# Patient Record
Sex: Male | Born: 1990
Health system: Southern US, Community
[De-identification: ages and names within clinical notes are randomized; demographics above are authoritative.]

---
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
## 2014-01-17 ENCOUNTER — Ambulatory Visit: Payer: Self-pay

## 2014-01-17 ENCOUNTER — Other Ambulatory Visit: Payer: Self-pay | Admitting: Occupational Medicine

## 2014-01-17 DIAGNOSIS — Z021 Encounter for pre-employment examination: Secondary | ICD-10-CM

## 2014-11-26 ENCOUNTER — Encounter (HOSPITAL_COMMUNITY): Payer: Self-pay | Admitting: Emergency Medicine

## 2014-11-26 ENCOUNTER — Emergency Department (INDEPENDENT_AMBULATORY_CARE_PROVIDER_SITE_OTHER)
Admission: EM | Admit: 2014-11-26 | Discharge: 2014-11-26 | Disposition: A | Discharge status: Home / Self Care | Payer: BLUE CROSS/BLUE SHIELD | Source: Home / Self Care | Attending: Family Medicine | Admitting: Family Medicine

## 2014-11-26 DIAGNOSIS — S39011A Strain of muscle, fascia and tendon of abdomen, initial encounter: Secondary | ICD-10-CM

## 2014-11-26 LAB — POCT URINALYSIS DIP (DEVICE)
Bilirubin Urine: NEGATIVE
Glucose, UA: NEGATIVE mg/dL
Hgb urine dipstick: NEGATIVE
KETONES UR: NEGATIVE mg/dL
NITRITE: NEGATIVE
PROTEIN: NEGATIVE mg/dL
Specific Gravity, Urine: 1.02 (ref 1.005–1.030)
Urobilinogen, UA: 0.2 mg/dL (ref 0.0–1.0)
pH: 7.5 (ref 5.0–8.0)

## 2014-11-26 MED ORDER — DICLOFENAC SODIUM 75 MG PO TBEC: 75.0000 mg | DELAYED_RELEASE_TABLET | Freq: Two times a day (BID) | ORAL | Status: DC | PRN

## 2014-11-26 MED ORDER — HYDROCODONE-ACETAMINOPHEN 5-325 MG PO TABS: 1.0000 | ORAL_TABLET | Freq: Four times a day (QID) | ORAL | Status: DC | PRN

## 2014-11-26 NOTE — ED Provider Notes (Signed)
Shawn DoughtyMichael Parma is a 24 y.o. male who presents to Urgent Care today for left abdominal pain and groin pain and testicle pain. Symptoms present for the last 3 days. Patient was moving a very heavy object at work on February 10th when he felt a pulling sensation in his left lower abdominal quadrant. Since then he's had intense pain in that area radiating to the left hip adductor insertional area and left scrotum. He denies any weakness or numbness bowel bladder dysfunction or difficulty walking. The pain is severe and worse with sitting and lying. The pain is relieved by standing. Patient has tried ibuprofen which has not helped. Patient has no symptoms with urination. The injury occurred at work.   History reviewed. No pertinent past medical history. History reviewed. No pertinent past surgical history. History  Substance Use Topics  . Smoking status: Never Smoker   . Smokeless tobacco: Not on file  . Alcohol Use: Yes   ROS as above Medications: No current facility-administered medications for this encounter.   Current Outpatient Prescriptions  Medication Sig Dispense Refill  . diclofenac (VOLTAREN) 75 MG EC tablet Take 1 tablet (75 mg total) by mouth 2 (two) times daily as needed. 60 tablet 0  . HYDROcodone-acetaminophen (NORCO/VICODIN) 5-325 MG per tablet Take 1 tablet by mouth every 6 (six) hours as needed. 30 tablet 0   No Known Allergies   Exam:  BP 136/56 mmHg  Pulse 71  Temp(Src) 97.9 F (36.6 C) (Oral)  Resp 16  SpO2 96% Gen: Well NAD HEENT: EOMI,  MMM Lungs: Normal work of breathing. CTABL Heart: RRR no MRG Abd: NABS, Soft. Nondistended, minimally tender with abdominal muscles relax. Severely tender in the left lower quadrant with abdominal muscles contracted. No guarding or rebound. Exts: Brisk capillary refill, warm and well perfused.  Genitals: No lymphadenopathy. Testicles are descended bilaterally and nontender in the right and mildly tender on the left. No masses or  swelling palpated. No herniation on either the right or left external inguinal ring. Hip exam:  Normal range of motion of the hips bilaterally. Patient has normal hip adductor and abductor strength on the right and normal abductor strength the left with limited hip adductor strength on the left due to pain. Negative Faber test and pretzel stretch bilaterally Negative straight leg raise test bilaterally. Lumbar exam nontender normal low back range of motion Reflexes are equal and normal bilaterally Sensation is intact throughout   Limited musculoskeletal ultrasound of the left abdominal muscle insertional areas:  The pubic symphysis was identified on ultrasound. The insertion of the oblique was visualized. There appears to be some disruption of the muscle fibers in that area without significant increase of Doppler flow. The terminal triangle region was identified. No bulging or herniation with Valsalva.  Results for orders placed or performed during the hospital encounter of 11/26/14 (from the past 24 hour(s))  POCT urinalysis dip (device)     Status: Abnormal   Collection Time: 11/26/14  9:44 AM  Result Value Ref Range   Glucose, UA NEGATIVE NEGATIVE mg/dL   Bilirubin Urine NEGATIVE NEGATIVE   Ketones, ur NEGATIVE NEGATIVE mg/dL   Specific Gravity, Urine 1.020 1.005 - 1.030   Hgb urine dipstick NEGATIVE NEGATIVE   pH 7.5 5.0 - 8.0   Protein, ur NEGATIVE NEGATIVE mg/dL   Urobilinogen, UA 0.2 0.0 - 1.0 mg/dL   Nitrite NEGATIVE NEGATIVE   Leukocytes, UA TRACE (A) NEGATIVE   No results found.  Assessment and Plan: 24 y.o. male with  significant lower abdominal muscular pain. I suspect the patient has torn his pulled his left oblique. He may additionally have a sports hernia however this is difficult to ascertain in the acute setting.  I am doubtful for serous etiology such as inguinal hernia, testicular torsion or epididymitis. Urine culture pending. Treat with rest diclofenac and  Norco. Follow up with sports medicine for further evaluation and management. Return to work Tuesday with limited duties for one week.  Discussed warning signs or symptoms. Please see discharge instructions. Patient expresses understanding.     Rodolph Bong, MD 11/26/14 1057

## 2014-11-26 NOTE — Discharge Instructions (Signed)
Thank you for coming in today. Follow up with sports medicine or orthopedics Take diclofenac twice daily. Use Norco for severe pain. Do not drive after taking this medication. If your belly pain worsens, or you have high fever, bad vomiting, blood in your stool or black tarry stool go to the Emergency Room. Come back or go to the emergency room if you notice new weakness new numbness problems walking or bowel or bladder problems.

## 2014-11-26 NOTE — ED Notes (Signed)
Reports doing a lot of heavy lifting at work.  States six weeks ago felt a tingling sensation in the left testicle which subsided.  States this past Wednesday felt the same sensation but with severe pain in the left testicle, left flank pain that radiates around to the front of the abdomen.  Reports having dysuria and frequency.  Denies hematuria.  No fever, n/v/d.  Pt has tried advil for pain with no relief.

## 2014-11-27 LAB — URINE CULTURE: SPECIAL REQUESTS: NORMAL

## 2016-11-15 DIAGNOSIS — B351 Tinea unguium: Secondary | ICD-10-CM | POA: Diagnosis not present

## 2018-06-18 ENCOUNTER — Other Ambulatory Visit: Payer: Self-pay

## 2018-06-18 ENCOUNTER — Ambulatory Visit (INDEPENDENT_AMBULATORY_CARE_PROVIDER_SITE_OTHER): Payer: BLUE CROSS/BLUE SHIELD

## 2018-06-18 ENCOUNTER — Encounter (HOSPITAL_COMMUNITY): Payer: Self-pay | Admitting: *Deleted

## 2018-06-18 ENCOUNTER — Ambulatory Visit (HOSPITAL_COMMUNITY)
Admission: EM | Admit: 2018-06-18 | Discharge: 2018-06-18 | Disposition: A | Discharge status: Home / Self Care | Payer: BLUE CROSS/BLUE SHIELD | Attending: Family Medicine | Admitting: Family Medicine

## 2018-06-18 DIAGNOSIS — A630 Anogenital (venereal) warts: Secondary | ICD-10-CM | POA: Insufficient documentation

## 2018-06-18 DIAGNOSIS — R0789 Other chest pain: Secondary | ICD-10-CM

## 2018-06-18 DIAGNOSIS — R079 Chest pain, unspecified: Secondary | ICD-10-CM | POA: Diagnosis not present

## 2018-06-18 MED ORDER — OMEPRAZOLE 20 MG PO CPDR: 20.0000 mg | DELAYED_RELEASE_CAPSULE | Freq: Every day | ORAL | 0 refills | Status: DC

## 2018-06-18 MED ORDER — NAPROXEN 375 MG PO TABS: 375.0000 mg | ORAL_TABLET | Freq: Two times a day (BID) | ORAL | 0 refills | Status: DC

## 2018-06-18 NOTE — ED Triage Notes (Signed)
C/o chest pain onset 2-3 weeks no change with movement. However he states as long as he is moving a round doesn't notice it as much. No injury

## 2018-06-18 NOTE — Discharge Instructions (Addendum)
Chest xray and EKG reassuring today.  We will try two different things to help with your chest pain- daily antacid to help with reflux as well as twice a day naproxen as an antiinflammtory. Take this with food. Caffeine can certainly increase heartburn pain.  Please establish with a primary care provider for recheck of symptoms and management as needed.  Will notify you of any positive findings and if any changes to treatment are needed based on your urine testing

## 2018-06-18 NOTE — ED Provider Notes (Signed)
[facility]    CSN: [id number] Arrival date & time: 06/18/18  1807     History   Chief Complaint Chief Complaint  Patient presents with  . Chest Pain    HPI Aldyn Toon is a 27 y.o. male.   Kyrillos presents with complaints of left sided chest pain which has been coming and going for the past 2.5 weeks. Today became constant. No known injury. At times radiates to left axilla. Felt it may have been exacerbated by caffeine. Improves with activity as he feels he does not notice it as much. No shortness of breath , no palpitations, no pain with deep breathing, no cough. No leg pain or swelling. States has noticed increased burning with belching recently, but denies heartburn or worsening of symptoms with eating. No nausea or vomiting. Gets headaches occasionally which he treats with ibuprofen, states only takes approximately every other week. With mild headache currently. Also complaints of "bumps" to genital/pubic region after shaving. No pain, no drainage. No redness or swelling or scrotal involvement. No penile discharge, no pain or frequency with urination. States has had the same partner for the past 4 years, no specific known exposure to STD but requests screening. Without contributing medical history.      ROS per HPI.      History reviewed. No pertinent past medical history.  There are no active problems to display for this patient.   History reviewed. No pertinent surgical history.     Home Medications    Prior to Admission medications   Medication Sig Start Date End Date Taking? Authorizing Provider  diclofenac (VOLTAREN) 75 MG EC tablet Take 1 tablet (75 mg total) by mouth 2 (two) times daily as needed. 11/26/14   Rodolph Bong, MD  HYDROcodone-acetaminophen (NORCO/VICODIN) 5-325 MG per tablet Take 1 tablet by mouth every 6 (six) hours as needed. 11/26/14   Rodolph Bong, MD  naproxen (NAPROSYN) 375 MG tablet Take 1 tablet (375 mg total) by mouth 2 (two)  times daily. 06/18/18   Georgetta Haber, NP  omeprazole (PRILOSEC) 20 MG capsule Take 1 capsule (20 mg total) by mouth daily. 06/18/18   Georgetta Haber, NP    Family History No family history on file.  Social History Social History   Tobacco Use  . Smoking status: Never Smoker  . Smokeless tobacco: Former Engineer, water Use Topics  . Alcohol use: Yes  . Drug use: Not on file     Allergies   Patient has no known allergies.   Review of Systems Review of Systems   Physical Exam Triage Vital Signs ED Triage Vitals  Enc Vitals Group     BP 06/18/18 1840 136/88     Pulse Rate 06/18/18 1840 70     Resp 06/18/18 1840 18     Temp 06/18/18 1840 98.7 F (37.1 C)     Temp Source 06/18/18 1840 Oral     SpO2 06/18/18 1840 97 %     Weight --      Height --      Head Circumference --      Peak Flow --      Pain Score 06/18/18 1841 8     Pain Loc --      Pain Edu? --      Excl. in GC? --    No data found.  Updated Vital Signs BP 136/88 (BP Location: Right Arm)   Pulse 70   Temp 98.7 F (37.1  C) (Oral)   Resp 18   SpO2 97%    Physical Exam  Constitutional: He is oriented to person, place, and time. He appears well-developed and well-nourished.  Cardiovascular: Normal rate and regular rhythm.  Pulmonary/Chest: Effort normal and breath sounds normal. He exhibits tenderness.  Mild tenderness left of sternum to anterior ribs with palpation    Genitourinary: Testes normal. Circumcised.     Genitourinary Comments: Raised flat skin toned colored circular lesions at base of penis, approximately 4 total; no redness, swelling, drainage or open skin  Neurological: He is alert and oriented to person, place, and time.  Skin: Skin is warm and dry.   EKG NSR hr 69 without acute changes   UC Treatments / Results  Labs (all labs ordered are listed, but only abnormal results are displayed) Labs Reviewed  URINE CYTOLOGY ANCILLARY ONLY    EKG None  Radiology Dg Chest 2  View  Result Date: 06/18/2018 CLINICAL DATA:  Left chest pain for 2.5 weeks. EXAM: CHEST - 2 VIEW COMPARISON:  January 17, 2014 FINDINGS: The heart size and mediastinal contours are within normal limits. Both lungs are clear. The visualized skeletal structures are unremarkable. IMPRESSION: No active cardiopulmonary disease. Electronically Signed   By: Sherian Rein M.D.   On: 06/18/2018 19:51    Procedures Procedures (including critical care time)  Medications Ordered in UC Medications - No data to display  Initial Impression / Assessment and Plan / UC Course  I have reviewed the triage vital signs and the nursing notes.  Pertinent labs & imaging results that were available during my care of the patient were reviewed by me and considered in my medical decision making (see chart for details).     ekg and chest xray without acute findings. Some reproduction of chest pain on palpation. Concern also for gerd symptoms. Genital warts present. Follow up with pcp for removal treatment as needed. Std screen performed and pending. Will notify of any positive findings and if any changes to treatment are needed.  Patient verbalized understanding and agreeable to plan.    Final Clinical Impressions(s) / UC Diagnoses   Final diagnoses:  Chest wall pain  Genital warts     Discharge Instructions     Chest xray and EKG reassuring today.  We will try two different things to help with your chest pain- daily antacid to help with reflux as well as twice a day naproxen as an antiinflammtory. Take this with food. Caffeine can certainly increase heartburn pain.  Please establish with a primary care provider for recheck of symptoms and management as needed.  Will notify you of any positive findings and if any changes to treatment are needed based on your urine testing     ED Prescriptions    Medication Sig Dispense Auth. Provider   omeprazole (PRILOSEC) 20 MG capsule Take 1 capsule (20 mg total) by mouth  daily. 30 capsule Linus Mako B, NP   naproxen (NAPROSYN) 375 MG tablet Take 1 tablet (375 mg total) by mouth 2 (two) times daily. 20 tablet Georgetta Haber, NP     Controlled Substance Prescriptions [place] Controlled Substance Registry consulted? Not Applicable   Georgetta Haber, NP 06/18/18 2000

## 2018-06-19 LAB — URINE CYTOLOGY ANCILLARY ONLY
Chlamydia: POSITIVE — AB
NEISSERIA GONORRHEA: NEGATIVE
Trichomonas: NEGATIVE

## 2018-06-22 ENCOUNTER — Telehealth (HOSPITAL_COMMUNITY): Payer: Self-pay

## 2018-06-22 ENCOUNTER — Telehealth: Payer: Self-pay

## 2018-06-22 MED ORDER — AZITHROMYCIN 250 MG PO TABS: 1000.0000 mg | ORAL_TABLET | Freq: Once | ORAL | 0 refills | Status: AC

## 2018-06-22 NOTE — Telephone Encounter (Signed)
Please disregard previous note.  Chlamydia is positive.  Rx po zithromax 1g #1 dose no refills was sent to the pharmacy of record.  Pt contacted and made aware, educated to please refrain from sexual intercourse for 7 days to give the medicine time to work, sexual partners need to be notified and tested/treated.  Condoms may reduce risk of reinfection.  Recheck or followup with PCP for further evaluation if symptoms are not improving.   [facility] notified

## 2018-06-22 NOTE — Telephone Encounter (Signed)
Chlamydia is positive.  This was treated at the urgent care visit with po zithromax 1g.  Pt contacted and made aware of results. Educated pt to please refrain from sexual intercourse for 7 days to give the medicine time to work.  Sexual partners need to be notified and tested/treated.  Condoms may reduce risk of reinfection.  Recheck or followup with PCP for further evaluation if symptoms are not improving.  [facility] notified 

## 2018-09-22 DIAGNOSIS — R05 Cough: Secondary | ICD-10-CM | POA: Diagnosis not present

## 2018-09-22 DIAGNOSIS — Z6827 Body mass index (BMI) 27.0-27.9, adult: Secondary | ICD-10-CM | POA: Diagnosis not present

## 2018-09-22 DIAGNOSIS — Z0000 Encounter for general adult medical examination without abnormal findings: Secondary | ICD-10-CM | POA: Diagnosis not present

## 2018-09-22 DIAGNOSIS — R945 Abnormal results of liver function studies: Secondary | ICD-10-CM | POA: Diagnosis not present

## 2018-09-22 DIAGNOSIS — Z1389 Encounter for screening for other disorder: Secondary | ICD-10-CM | POA: Diagnosis not present

## 2018-09-22 DIAGNOSIS — E663 Overweight: Secondary | ICD-10-CM | POA: Diagnosis not present

## 2019-12-17 IMAGING — DX DG CHEST 2V
2 series · 2 of 2 positions shown · non-contrast
Comparison: January 17, 2014

CLINICAL DATA: Left chest pain for 2.5 weeks.

EXAM:
CHEST - 2 VIEW

[chest pa]
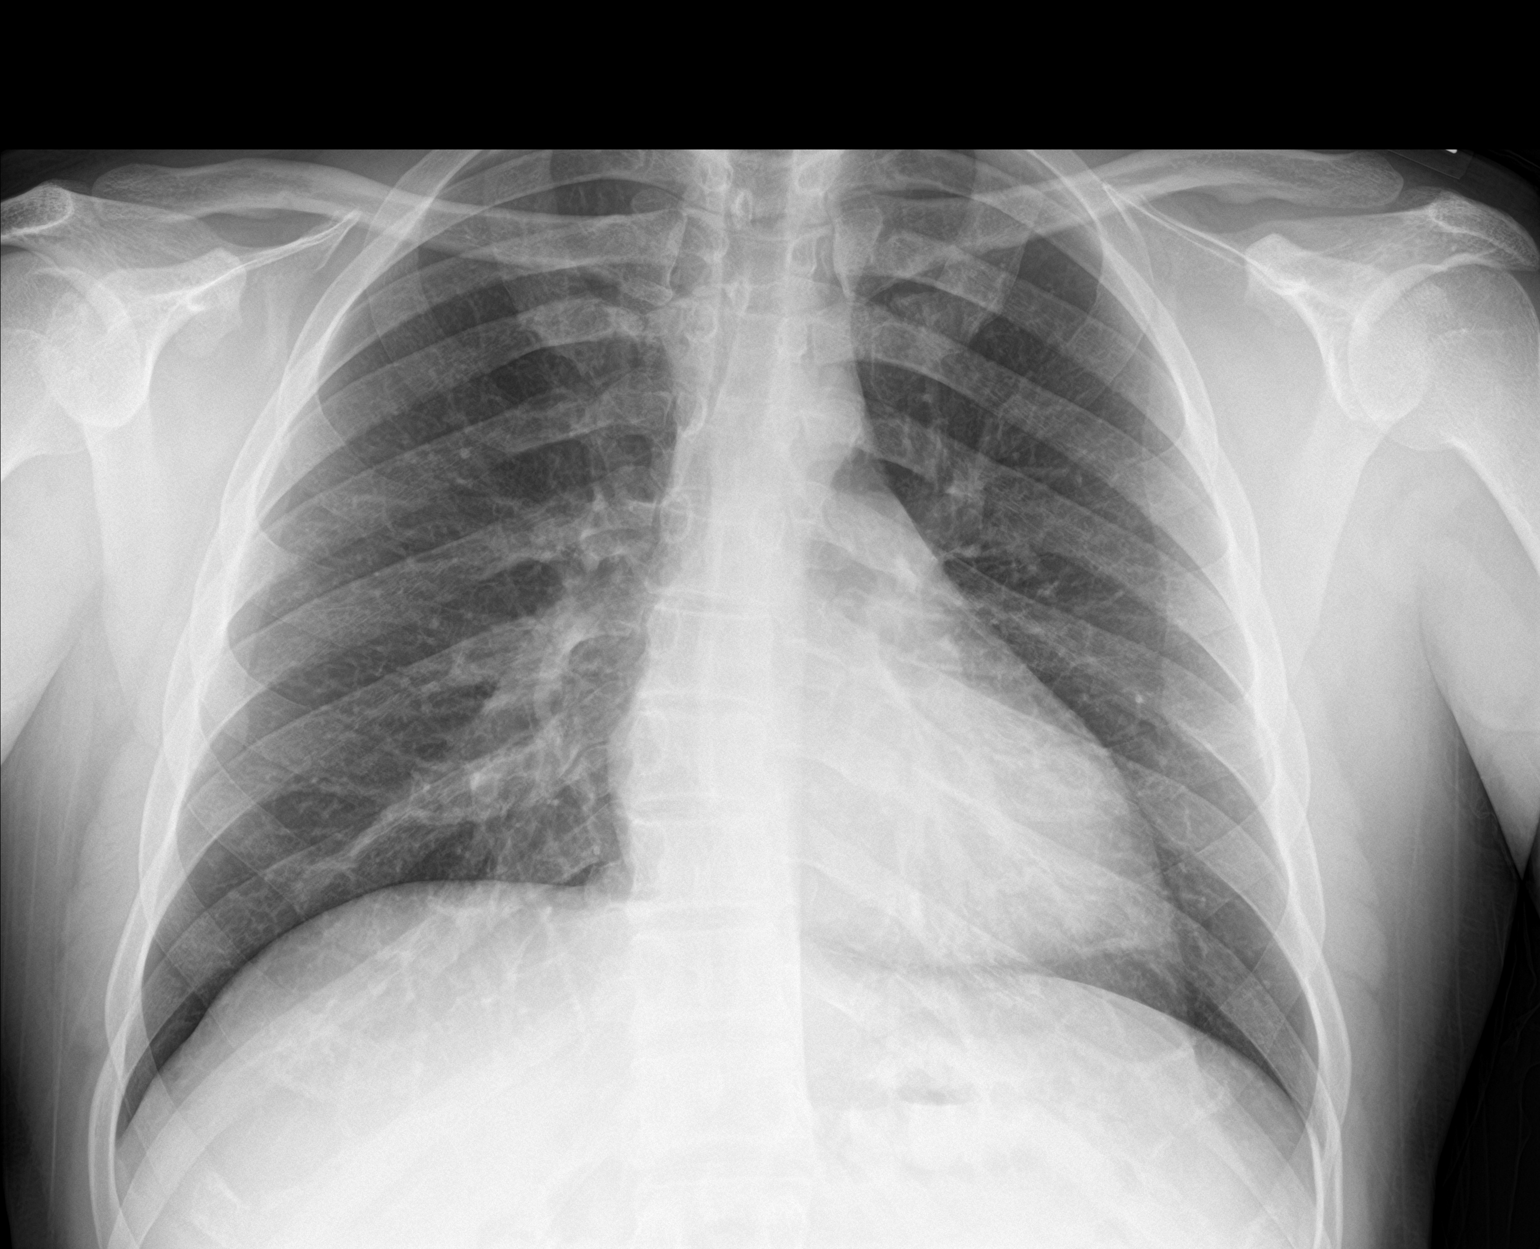

[chest lat]
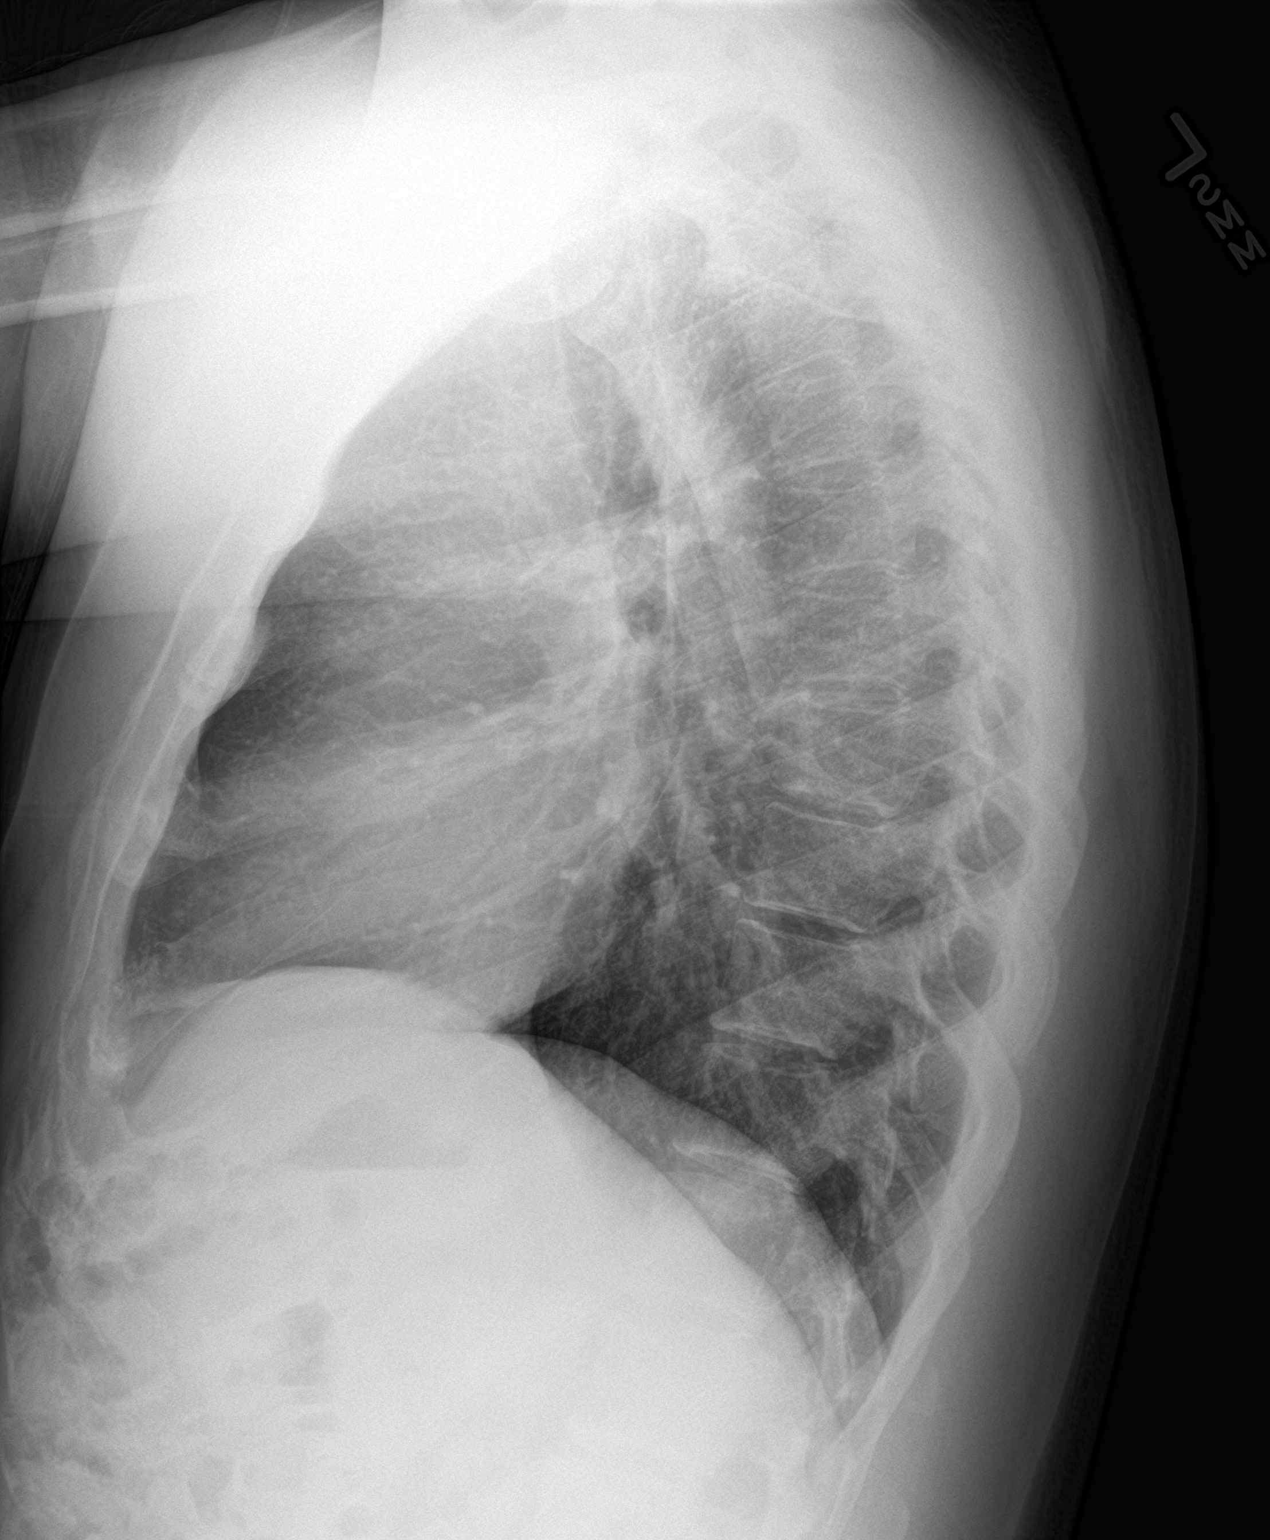

[2 of 2 positions shown; findings below may reference images not displayed]

FINDINGS: The heart size and mediastinal contours are within normal limits.
Both lungs are clear. The visualized skeletal structures are
unremarkable.
IMPRESSION: No active cardiopulmonary disease.

## 2020-03-27 DIAGNOSIS — M2550 Pain in unspecified joint: Secondary | ICD-10-CM | POA: Diagnosis not present

## 2020-03-27 DIAGNOSIS — M778 Other enthesopathies, not elsewhere classified: Secondary | ICD-10-CM | POA: Diagnosis not present

## 2020-03-27 DIAGNOSIS — M25531 Pain in right wrist: Secondary | ICD-10-CM | POA: Diagnosis not present

## 2020-03-27 DIAGNOSIS — T733XXA Exhaustion due to excessive exertion, initial encounter: Secondary | ICD-10-CM | POA: Diagnosis not present

## 2020-12-18 ENCOUNTER — Ambulatory Visit
Admission: EM | Admit: 2020-12-18 | Discharge: 2020-12-18 | Disposition: A | Discharge status: Home / Self Care | Payer: BC Managed Care – PPO | Attending: Emergency Medicine | Admitting: Emergency Medicine

## 2020-12-18 ENCOUNTER — Encounter: Payer: Self-pay | Admitting: Emergency Medicine

## 2020-12-18 DIAGNOSIS — R350 Frequency of micturition: Secondary | ICD-10-CM | POA: Diagnosis not present

## 2020-12-18 DIAGNOSIS — R300 Dysuria: Secondary | ICD-10-CM | POA: Insufficient documentation

## 2020-12-18 DIAGNOSIS — M545 Low back pain, unspecified: Secondary | ICD-10-CM | POA: Insufficient documentation

## 2020-12-18 LAB — POCT URINALYSIS DIP (MANUAL ENTRY)
Bilirubin, UA: NEGATIVE
Glucose, UA: NEGATIVE mg/dL
Ketones, POC UA: NEGATIVE mg/dL
Leukocytes, UA: NEGATIVE
Nitrite, UA: NEGATIVE
Protein Ur, POC: NEGATIVE mg/dL
Spec Grav, UA: 1.025 (ref 1.010–1.025)
Urobilinogen, UA: 0.2 E.U./dL
pH, UA: 7 (ref 5.0–8.0)

## 2020-12-18 MED ORDER — PHENAZOPYRIDINE HCL 100 MG PO TABS: 100.0000 mg | ORAL_TABLET | Freq: Three times a day (TID) | ORAL | 0 refills | Status: DC | PRN

## 2020-12-18 NOTE — ED Provider Notes (Signed)
[facility]   Chief Complaint  Patient presents with  . Urinary Tract Infection     SUBJECTIVE:  Shawn Riley is a 30 y.o. male who presented to the urgent care for complaint of dysuria, frequent urination and lower back pain for the past few weeks.  Denies any precipitating event.  Localizes the pain to the lower back.  Pain is intermittent described as aching character.  Has tried OTC medications without relief.  Symptoms are made worse with urination.  Denies similar symptoms in the past.  Denies fever, chills, nausea, vomiting, abdominal pain, flank pain,  hematuria.    LMP: No LMP for male patient.  ROS: As in HPI.  All other pertinent ROS negative.     History reviewed. No pertinent past medical history. History reviewed. No pertinent surgical history. No Known Allergies No current facility-administered medications on file prior to encounter.   Current Outpatient Medications on File Prior to Encounter  Medication Sig Dispense Refill  . diclofenac (VOLTAREN) 75 MG EC tablet Take 1 tablet (75 mg total) by mouth 2 (two) times daily as needed. 60 tablet 0  . HYDROcodone-acetaminophen (NORCO/VICODIN) 5-325 MG per tablet Take 1 tablet by mouth every 6 (six) hours as needed. 30 tablet 0  . naproxen (NAPROSYN) 375 MG tablet Take 1 tablet (375 mg total) by mouth 2 (two) times daily. 20 tablet 0  . omeprazole (PRILOSEC) 20 MG capsule Take 1 capsule (20 mg total) by mouth daily. 30 capsule 0   Social History   Socioeconomic History  . Marital status: Single    Spouse name: Not on file  . Number of children: Not on file  . Years of education: Not on file  . Highest education level: Not on file  Occupational History  . Not on file  Tobacco Use  . Smoking status: Never Smoker  . Smokeless tobacco: Former Engineer, water and Sexual Activity  . Alcohol use: Yes  . Drug use: Not on file  . Sexual activity: Yes    Birth control/protection: Condom  Other Topics Concern   . Not on file  Social History Narrative  . Not on file   Social Determinants of Health   Financial Resource Strain: Not on file  Food Insecurity: Not on file  Transportation Needs: Not on file  Physical Activity: Not on file  Stress: Not on file  Social Connections: Not on file  Intimate Partner Violence: Not on file   History reviewed. No pertinent family history.  OBJECTIVE:  Vitals:   12/18/20 0830  BP: 132/83  Pulse: 71  Resp: 18  Temp: 98.2 F (36.8 C)  TempSrc: Oral  SpO2: 97%   General appearance: AOx3 in no acute distress HEENT: NCAT.  Oropharynx clear.  Lungs: clear to auscultation bilaterally without adventitious breath sounds Heart: regular rate and rhythm.  Radial pulses 2+ symmetrical bilaterally Abdomen: soft; non-distended; no tenderness; bowel sounds present; no guarding or rebound tenderness Back: no CVA tenderness Extremities: no edema; symmetrical with no gross deformities Skin: warm and dry Neurologic: Ambulates from chair to exam table without difficulty Psychological: alert and cooperative; normal mood and affect  Labs Reviewed  POCT URINALYSIS DIP (MANUAL ENTRY) - Abnormal; Notable for the following components:      Result Value   Blood, UA trace-intact (*)    All other components within normal limits  URINE CULTURE    ASSESSMENT & PLAN:  1. Dysuria   2. Frequent urination   3. Acute low back  pain without sciatica, unspecified back pain laterality     Meds ordered this encounter  Medications  . phenazopyridine (PYRIDIUM) 100 MG tablet    Sig: Take 1 tablet (100 mg total) by mouth 3 (three) times daily as needed for pain.    Dispense:  10 tablet    Refill:  0   Discharge Instructions  Urine culture sent.  We will call you with the results.   Push fluids and get plenty of rest.   Take pyridium as prescribed and as needed for symptomatic relief Follow up with PCP if symptoms persists Return here or go to ER if you have any new  or worsening symptoms such as fever, worsening abdominal pain, nausea/vomiting, flank pain, etc...  Outlined signs and symptoms indicating need for more acute intervention. Patient verbalized understanding. After Visit Summary given.     Durward Parcel, FNP 12/18/20 [phone]

## 2020-12-18 NOTE — ED Triage Notes (Signed)
Lower back pain bilateral x 1 month.  Pain on urination and urinary frequency.

## 2020-12-18 NOTE — Discharge Instructions (Signed)
Urine culture sent.  We will call you with the results.   Push fluids and get plenty of rest.   Take pyridium as prescribed and as needed for symptomatic relief Follow up with PCP if symptoms persists Return here or go to ER if you have any new or worsening symptoms such as fever, worsening abdominal pain, nausea/vomiting, flank pain, etc... 

## 2020-12-20 LAB — URINE CULTURE: Culture: NO GROWTH

## 2022-05-07 ENCOUNTER — Telehealth (INDEPENDENT_AMBULATORY_CARE_PROVIDER_SITE_OTHER): Payer: BC Managed Care – PPO | Admitting: Clinical

## 2022-05-07 DIAGNOSIS — F419 Anxiety disorder, unspecified: Secondary | ICD-10-CM | POA: Diagnosis not present

## 2022-05-07 DIAGNOSIS — F331 Major depressive disorder, recurrent, moderate: Secondary | ICD-10-CM | POA: Diagnosis not present
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
## 2022-05-09 ENCOUNTER — Telehealth (INDEPENDENT_AMBULATORY_CARE_PROVIDER_SITE_OTHER): Payer: BC Managed Care – PPO | Admitting: Clinical

## 2022-05-09 DIAGNOSIS — F331 Major depressive disorder, recurrent, moderate: Secondary | ICD-10-CM | POA: Diagnosis not present

## 2022-05-09 DIAGNOSIS — F419 Anxiety disorder, unspecified: Secondary | ICD-10-CM

## 2022-05-09 NOTE — Progress Notes (Signed)
IN PERSON  I connected with [name] on 05/09/22 at  in person and verified that I am speaking with the correct person using two identifiers.  Location: Patient: OFFICE Provider: OFFICE   I discussed the limitations of evaluation and management by telemedicine and the availability of in person appointments. The patient expressed understanding and agreed to proceed.  THERAPIST PROGRESS NOTE   Session Time: 9:10 AM-10:00 AM   Participation Level: Active   Behavioral Response: CasualAlertDepressed   Type of Therapy: Individual Therapy   Treatment Goals addressed: Coping   Interventions: CBT and Strength-based   Summary: Shawn Riley is a 31 y.o. male who presents with Depression with Anxiety. The OPT therapist worked with the patient for his ongoing OPT treatment. The OPT therapist utilized Motivational Interviewing to assist in creating therapeutic repore. The patient in the session was engaged and work in collaboration giving feedback about his triggers and symptoms over the past few weeks.The patient spoke about changes recently that effected him and are the cause of him reaching out for counseling services these including a recent break up with his long time girlfriend who is the Mother of his child, work related stress from job advancement, and unresolved problems around his relationship from childhood and current with his Mother.The OPT therapist utilized Cognitive Behavioral Therapy through cognitive restructuring as well as worked with the patient on coping strategies to assist in management of his mental health symptoms. The OPT therapist provided empathetic support allowing the patient to disclose events leading up to the break up with his former girlfriend and the impact of the break up on him including changing how much he sees his son [name]. The OPT therapsit worked with the patient on management of his current stressors including working to not take on the weight of change  in areas that are not in his control.   Suicidal/Homicidal: Nowithout intent/plan   Therapist Response: The OPT therapist worked with the patient for the patients scheduled session. The patient was engaged in his session and gave feedback in relation to triggers, symptoms, and behavior responses over the past few weeks.The OPT therapist worked with the patient utilizing an in session Cognitive Behavioral Therapy exercise. The patient reviewed with the OPT therapist coping skills to help manage his mood. The patient spoke about his work and the stress/responsibility that comes with his position. The patient is still on talking terms and has been communicating with his ex-girlfriend and noted he took ownership for his faults in their relationship. The patient worked in session with the OPT therapist on exercise in my control vs out of my control.The OPT therapist will continue treatment work with the patient in his next scheduled session.   Plan: Return again in 2/3 weeks.   Diagnosis:      Axis I: Major depressive disorder, recurrent episode, moderate                           Axis II: No diagnosis   Collaboration of Care: No additional collaboration of care for this session.     Patient/Guardian was advised Release of Information must be obtained prior to any record release in order to collaborate their care with an outside provider. Patient/Guardian was advised if they have not already done so to contact the registration department to sign all necessary forms in order for [place] to release information regarding their care.    Consent: Patient/Guardian gives verbal consent for treatment and assignment  of benefits for services provided during this visit. Patient/Guardian expressed understanding and agreed to proceed     I discussed the assessment and treatment plan with the patient. The patient was provided an opportunity to ask questions and all were answered. The patient agreed with the plan and  demonstrated an understanding of the instructions.   The patient was advised to call back or seek an in-person evaluation if the symptoms worsen or if the condition fails to improve as anticipated.   I provided 50 minutes of face-to-face time during this encounter.   [name], LCSW   05/08/2022

## 2022-05-16 ENCOUNTER — Ambulatory Visit (INDEPENDENT_AMBULATORY_CARE_PROVIDER_SITE_OTHER): Payer: BC Managed Care – PPO | Admitting: Clinical

## 2022-05-16 DIAGNOSIS — F331 Major depressive disorder, recurrent, moderate: Secondary | ICD-10-CM | POA: Diagnosis not present

## 2022-05-16 DIAGNOSIS — F419 Anxiety disorder, unspecified: Secondary | ICD-10-CM

## 2022-05-16 NOTE — Progress Notes (Signed)
IN PERSON   I connected with [name] on 05/16/22 at  in person and verified that I am speaking with the correct person using two identifiers.   Location: Patient: OFFICE Provider: OFFICE   I discussed the limitations of evaluation and management by telemedicine and the availability of in person appointments. The patient expressed understanding and agreed to proceed.   THERAPIST PROGRESS NOTE   Session Time: 9:00 AM-10:00 AM   Participation Level: Active   Behavioral Response: CasualAlertDepressed   Type of Therapy: Individual Therapy   Treatment Goals addressed: Coping   Interventions: CBT and Strength-based   Summary: Shawn Riley is a 31 y.o. male who presents with Depression with Anxiety. The OPT therapist worked with the patient for his ongoing OPT treatment. The OPT therapist utilized Motivational Interviewing to assist in creating therapeutic repore. The patient in the session was engaged and work in collaboration giving feedback about his triggers and symptoms over the past few weeks.The patient spoke about ongoing difficulty with his current separation and the emotional toll it is currently taking on him,.The OPT therapist utilized Cognitive Behavioral Therapy through cognitive restructuring as well as worked with the patient on coping strategies to assist in management of his mental health symptoms. The OPT therapist provided empathetic support allowing the patient to disclose his feelings as well as his ongoing admission of feeling that he is to blame for the separation. The OPT therapsit worked with the patient on management of his current stressors including working to not take on the weight of change in areas that are not in his control.   Suicidal/Homicidal: Nowithout intent/plan   Therapist Response: The OPT therapist worked with the patient for the patients scheduled session. The patient was engaged in his session and gave feedback in relation to triggers,  symptoms, and behavior responses over the past few weeks.The OPT therapist worked with the patient utilizing an in session Cognitive Behavioral Therapy exercise. The patient reviewed with the OPT therapist coping skills to help manage his mood while going through the transition of separation. The patient spoke about the ongoing impact emotionally as the situation continues to serve as a trigger for his mental health symptoms. The patient is still on talking terms and has been communicating with his ex-girlfriend but has come to the decision of trying to focus on himself and use his supports to move towards not being as emotionally devastated and to be able to continue to function. The patient identified interactions with his Father more frequently recently as a positive and protective factor as well as going to the gym. The OPT therapist placed strong emphasis on the patient focusing on his basic health needs, using coping skills, and using his support network. The patient worked in session with the OPT therapist on exercise in my control vs out of my control.The OPT therapist will continue treatment work with the patient in his next scheduled session.   Plan: Return again in 2/3 weeks.   Diagnosis:      Axis I: Major depressive disorder, recurrent episode, moderate                           Axis II: No diagnosis   Collaboration of Care: No additional collaboration of care for this session.     Patient/Guardian was advised Release of Information must be obtained prior to any record release in order to collaborate their care with an outside provider. Patient/Guardian was advised if they  have not already done so to contact the registration department to sign all necessary forms in order for [place] to release information regarding their care.    Consent: Patient/Guardian gives verbal consent for treatment and assignment of benefits for services provided during this visit. Patient/Guardian expressed  understanding and agreed to proceed     I discussed the assessment and treatment plan with the patient. The patient was provided an opportunity to ask questions and all were answered. The patient agreed with the plan and demonstrated an understanding of the instructions.   The patient was advised to call back or seek an in-person evaluation if the symptoms worsen or if the condition fails to improve as anticipated.   I provided 55 minutes of face-to-face time during this encounter.   [name], LCSW   05/16/2022

## 2022-05-20 ENCOUNTER — Telehealth (INDEPENDENT_AMBULATORY_CARE_PROVIDER_SITE_OTHER): Payer: BC Managed Care – PPO | Admitting: Clinical

## 2022-05-20 DIAGNOSIS — F419 Anxiety disorder, unspecified: Secondary | ICD-10-CM

## 2022-05-20 DIAGNOSIS — F331 Major depressive disorder, recurrent, moderate: Secondary | ICD-10-CM | POA: Diagnosis not present

## 2022-05-20 NOTE — Progress Notes (Signed)
Virtual Visit via Video Note  I connected with Shawn Riley on 05/20/22 at 10:00 AM EDT by a video enabled telemedicine application and verified that I am speaking with the correct person using two identifiers.  Location: Patient: Home Provider: Office   I discussed the limitations of evaluation and management by telemedicine and the availability of in person appointments. The patient expressed understanding and agreed to proceed.  THERAPIST PROGRESS NOTE   Session Time: 10:00 AM-10:45 AM   Participation Level: Active   Behavioral Response: CasualAlertDepressed   Type of Therapy: Individual Therapy   Treatment Goals addressed: Coping   Interventions: CBT and Strength-based   Summary: Shawn Riley is a 31 y.o. male who presents with Depression with Anxiety. The OPT therapist worked with the patient for his ongoing OPT treatment. The OPT therapist utilized Motivational Interviewing to assist in creating therapeutic repore. The patient in the session was engaged and work in collaboration giving feedback about his triggers and symptoms over the past few weeks.The patient spoke about ongoing difficulty with his current separation and the emotional toll it is currently taking on him. Within the past 48hrs the patient learned that his partnerfound out she is pregnant with his child further complicating the separation. The patient spoke about his concern that the partner will decide to abort and he is not in favor of abortion. The patient spoke the impact it will have if she does aport knowing that its his child she is carrying.The OPT therapist utilized Cognitive Behavioral Therapy through cognitive restructuring as well as worked with the patient on coping strategies to assist in management of his mental health symptoms. The OPT therapist provided empathetic support allowing the patient to disclose his feelings . The OPT therapsit worked with the patient on management of his current stressors  including working to not take on the weight of change in areas that are not in his control.   Suicidal/Homicidal: Nowithout intent/plan   Therapist Response: The OPT therapist worked with the patient for the patients scheduled session. The patient was engaged in his session and gave feedback in relation to triggers, symptoms, and behavior responses over the past few weeks.The OPT therapist worked with the patient utilizing an in session Cognitive Behavioral Therapy exercise. The patient reviewed with the OPT therapist coping skills to help manage his mood while going through the transition of separation and now recently learning his partner is pregnant with his child and is considering abortion. The patient spoke about the ongoing impact emotionally as the situation continues to serve as a trigger for his mental health symptoms. The patient is still on talking terms and has been communicating with his ex-girlfriend but has come to the decision of trying to focus on himself and use his supports to move towards not being as emotionally devastated and to be able to continue to function. The patient identified still currently being in shock after learning the ex-girlfriend is pregnant, being strongly against abortion, and concerned about his ability to have a future with his ex-girlfriend who he wants back if she moves forward with aborting the child. The OPT therapist placed strong emphasis on the patient focusing on his basic health needs, using coping skills, and using his support network. The patient worked in session with the OPT therapist on exercise in my control vs out of my control.The OPT therapist will continue treatment work with the patient in his next scheduled session.   Plan: Return again in 2/3 weeks.   Diagnosis:  Axis I: Major depressive disorder, recurrent episode, moderate                           Axis II: No diagnosis   Collaboration of Care: No additional collaboration of care for  this session.     Patient/Guardian was advised Release of Information must be obtained prior to any record release in order to collaborate their care with an outside provider. Patient/Guardian was advised if they have not already done so to contact the registration department to sign all necessary forms in order for [place] to release information regarding their care.    Consent: Patient/Guardian gives verbal consent for treatment and assignment of benefits for services provided during this visit. Patient/Guardian expressed understanding and agreed to proceed     I discussed the assessment and treatment plan with the patient. The patient was provided an opportunity to ask questions and all were answered. The patient agreed with the plan and demonstrated an understanding of the instructions.   The patient was advised to call back or seek an in-person evaluation if the symptoms worsen or if the condition fails to improve as anticipated.   I provided 45 minutes of non-face-to-face time during this encounter.   [name], LCSW   05/20/2022

## 2022-05-24 ENCOUNTER — Ambulatory Visit (INDEPENDENT_AMBULATORY_CARE_PROVIDER_SITE_OTHER): Payer: BC Managed Care – PPO | Admitting: Clinical

## 2022-05-24 DIAGNOSIS — F419 Anxiety disorder, unspecified: Secondary | ICD-10-CM | POA: Diagnosis not present

## 2022-05-24 DIAGNOSIS — F331 Major depressive disorder, recurrent, moderate: Secondary | ICD-10-CM

## 2022-05-24 NOTE — Progress Notes (Signed)
Virtual Visit via Video Note   I connected with Shawn Riley on 05/24/22 at 8:00 AM EDT by a video enabled telemedicine application and verified that I am speaking with the correct person using two identifiers.   Location: Patient: Home Provider: Office   I discussed the limitations of evaluation and management by telemedicine and the availability of in person appointments. The patient expressed understanding and agreed to proceed.   THERAPIST PROGRESS NOTE   Session Time: 8:00 AM-8:30 AM   Participation Level: Active   Behavioral Response: CasualAlertDepressed   Type of Therapy: Individual Therapy   Treatment Goals addressed: Coping   Interventions: CBT and Strength-based   Summary: Bond Grieshop is a 31 y.o. male who presents with Depression with Anxiety. The OPT therapist worked with the patient for his ongoing OPT treatment. The OPT therapist utilized Motivational Interviewing to assist in creating therapeutic repore. The patient in the session was engaged and work in collaboration giving feedback about his triggers and symptoms over the past few weeks.The patient spoke about ongoing difficulty with his current separation and the emotional toll it is currently taking on him. The patient since the last session has ben talking with his ex and did have a conversation with the ex expressing his feelings around the ex considering having an abortion. The patients ex has at this point verbalized that she is not going to abort. The patient spoke about over the past week having a lot of  serious conversations with his ex who has at times been staying with the patient in his home. The patient spoke about this realization that things are still very much in the air and that what is going to happen moving forward will have pieces that are in him control and parts that are not going to be in control.The OPT therapist utilized Cognitive Behavioral Therapy through cognitive restructuring as well as  worked with the patient on coping strategies to assist in management of his mental health symptoms. The OPT therapist provided empathetic support allowing the patient to disclose his feelings . The OPT therapsit worked with the patient on management of his current stressors including working to not take on the weight of change in areas that are not in his control. The patient spoke with hope that the pregnancy continues and that he ultimately reunite with his pregnant ex. The patient is going away this weekend with his son and his ex on a mini-vacation in which he hopes will bring them closer together and move him further towards his goal of reconciliation.   Suicidal/Homicidal: Nowithout intent/plan   Therapist Response: The OPT therapist worked with the patient for the patients scheduled session. The patient was engaged in his session and gave feedback in relation to triggers, symptoms, and behavior responses over the past few weeks.The OPT therapist worked with the patient utilizing an in session Cognitive Behavioral Therapy exercise. The patient reviewed with the OPT therapist coping skills to help manage his mood while going through the transition of separation and and recent stress of learning his ex is pregnant with his 2nd child, however, noted after most recnt talks with the ex she has verbalized deciding not to abort the child. The patient is going to be spending time with his child and the ex over the upcoming weekend as part of a mini-vacation.  The OPT therapist placed strong emphasis on the patient focusing on his basic health needs, using coping skills, and using his support network. The patient worked in session  with the OPT therapist on exercise in my control vs out of my control.The OPT therapist will continue treatment work with the patient in his next scheduled session.   Plan: Return again in 2/3 weeks.   Diagnosis:      Axis I: Major depressive disorder, recurrent episode, moderate                            Axis II: No diagnosis   Collaboration of Care: No additional collaboration of care for this session.     Patient/Guardian was advised Release of Information must be obtained prior to any record release in order to collaborate their care with an outside provider. Patient/Guardian was advised if they have not already done so to contact the registration department to sign all necessary forms in order for [place] to release information regarding their care.    Consent: Patient/Guardian gives verbal consent for treatment and assignment of benefits for services provided during this visit. Patient/Guardian expressed understanding and agreed to proceed     I discussed the assessment and treatment plan with the patient. The patient was provided an opportunity to ask questions and all were answered. The patient agreed with the plan and demonstrated an understanding of the instructions.   The patient was advised to call back or seek an in-person evaluation if the symptoms worsen or if the condition fails to improve as anticipated.   I provided 30 minutes of non-face-to-face time during this encounter.   [name], LCSW   05/24/2022

## 2022-06-05 ENCOUNTER — Ambulatory Visit (INDEPENDENT_AMBULATORY_CARE_PROVIDER_SITE_OTHER): Payer: BC Managed Care – PPO | Admitting: Clinical

## 2022-06-05 DIAGNOSIS — F331 Major depressive disorder, recurrent, moderate: Secondary | ICD-10-CM

## 2022-06-05 DIAGNOSIS — F419 Anxiety disorder, unspecified: Secondary | ICD-10-CM

## 2022-06-05 NOTE — Progress Notes (Signed)
IN Person  I connected with Shawn Riley on 06/05/22 at  9:00 AM EDT by a video enabled telemedicine application in person and verified that I am speaking with the correct person using two identifiers.  Location: Patient: Office Provider: Office    I discussed the limitations of evaluation and management by telemedicine and the availability of in person appointments. The patient expressed understanding and agreed to proceed.  Therapy Progress Note   Session Time: 9:00 AM-9:55 AM   Participation Level: Active   Behavioral Response: CasualAlertDepressed   Type of Therapy: Individual Therapy   Treatment Goals addressed: Coping   Interventions: CBT and Strength-based   Summary: Shawn Riley is a 31 y.o. male who presents with Depression with Anxiety. The OPT therapist worked with the patient for his ongoing OPT treatment. The OPT therapist utilized Motivational Interviewing to assist in creating therapeutic repore. The patient in the session was engaged and work in collaboration giving feedback about his triggers and symptoms over the past few weeks.The patient spoke about ongoing difficulty with his current situation with his pregnant ex-girlfriend and the emotional toll it is currently taking on him. The patient since the last session has ben talking with his ex and at this time she has decided to move forward with the pregnancy and not terminate as well as continue to be open to trying to reunite and fix the relationship with the patient.  The patient spoke about over the past week having a lot of  serious conversations with his ex who has at times been staying with the patient in his home and even going this past weekend on a get away together. The patient spoke about this realization that things are still very much in the air and that what is going to happen moving forward will have pieces that are in him control and parts that are not going to be in control.The OPT therapist utilized  Cognitive Behavioral Therapy through cognitive restructuring as well as worked with the patient on coping strategies to assist in management of his mental health symptoms. The OPT therapist provided empathetic support allowing the patient to disclose his feelings . The OPT therapsit worked with the patient on management of his current stressors including working to not take on the weight of change in areas that are not in his control.   Suicidal/Homicidal: Nowithout intent/plan   Therapist Response: The OPT therapist worked with the patient for the patients scheduled session. The patient was engaged in his session and gave feedback in relation to triggers, symptoms, and behavior responses over the past few weeks.The OPT therapist worked with the patient utilizing an in session Cognitive Behavioral Therapy exercise. The patient reviewed with the OPT therapist coping skills to help manage his mood while going through the transition and working on reconciliation with his long term girlfriend .  The OPT therapist placed strong emphasis on the patient focusing on his basic health needs, using coping skills, and using his support network. The patient worked in session with the OPT therapist on exercise in my control vs out of my control.The OPT therapist will continue treatment work with the patient in his next scheduled session.   Plan: Return again in 2/3 weeks.   Diagnosis:      Axis I: Major depressive disorder, recurrent episode, moderate                           Axis II: No diagnosis  Collaboration of Care: No additional collaboration of care for this session.     Patient/Guardian was advised Release of Information must be obtained prior to any record release in order to collaborate their care with an outside provider. Patient/Guardian was advised if they have not already done so to contact the registration department to sign all necessary forms in order for [place] to release information regarding their  care.    Consent: Patient/Guardian gives verbal consent for treatment and assignment of benefits for services provided during this visit. Patient/Guardian expressed understanding and agreed to proceed     I discussed the assessment and treatment plan with the patient. The patient was provided an opportunity to ask questions and all were answered. The patient agreed with the plan and demonstrated an understanding of the instructions.   The patient was advised to call back or seek an in-person evaluation if the symptoms worsen or if the condition fails to improve as anticipated.   I provided 55 minutes of face-to-face time during this encounter.   [name], LCSW   06/05/2022

## 2022-06-14 ENCOUNTER — Telehealth (INDEPENDENT_AMBULATORY_CARE_PROVIDER_SITE_OTHER): Payer: BC Managed Care – PPO | Admitting: Clinical

## 2022-06-14 DIAGNOSIS — F331 Major depressive disorder, recurrent, moderate: Secondary | ICD-10-CM

## 2022-06-14 DIAGNOSIS — F419 Anxiety disorder, unspecified: Secondary | ICD-10-CM | POA: Diagnosis not present

## 2022-06-14 NOTE — Progress Notes (Addendum)
Virtual Visit via Video Note  I connected with Shawn Riley on 06/14/22 at  9:00 AM EDT by a video enabled telemedicine application and verified that I am speaking with the correct person using two identifiers.  Location: Patient: Home Provider: Office   I discussed the limitations of evaluation and management by telemedicine and the availability of in person appointments. The patient expressed understanding and agreed to proceed.    Therapy Progress Note   Session Time: 9:00 AM-9:55 AM   Participation Level: Active   Behavioral Response: CasualAlertDepressed   Type of Therapy: Individual Therapy   Treatment Goals addressed: Coping   Interventions: CBT and Strength-based   Summary: Shawn Riley is a 31 y.o. male who presents with Depression with Anxiety. The OPT therapist worked with the patient for his ongoing OPT treatment. The OPT therapist utilized Motivational Interviewing to assist in creating therapeutic repore. The patient in the session was engaged and work in collaboration giving feedback about his triggers and symptoms over the past few weeks.The patient spoke about ongoing difficulty with his current situation with his pregnant ex-girlfriend.  The patient spoke about this realization that things are still very much in the air and that what is going to happen moving forward will have pieces that are in him control and parts that are not going to be in control.The OPT therapist utilized Cognitive Behavioral Therapy through cognitive restructuring as well as worked with the patient on coping strategies to assist in management of his mental health symptoms. The OPT therapist provided empathetic support allowing the patient to disclose his feelings . The OPT therapsit worked with the patient on management of his current stressors including working to not take on the weight of change in areas that are not in his control.   Suicidal/Homicidal: Nowithout intent/plan   Therapist  Response: The OPT therapist worked with the patient for the patients scheduled session. The patient was engaged in his session and gave feedback in relation to triggers, symptoms, and behavior responses over the past few weeks.The OPT therapist worked with the patient utilizing an in session Cognitive Behavioral Therapy exercise. The patient reviewed with the OPT therapist coping skills to help manage his mood while going through the transition and working on reconciliation with his long term girlfriend . The patient in this session noted, " I am working on managing my emotions and reactions and trying to implement a pause when needed and take a few breaths".The OPT therapist placed strong emphasis on the patient focusing on his basic health needs, using coping skills, and using his support network. The patient worked in session with the OPT therapist on exercise in my control vs out of my control.The OPT therapist will continue treatment work with the patient in his next scheduled session.   Plan: Return again in 2/3 weeks.   Diagnosis:      Axis I: Major depressive disorder, recurrent episode, moderate                           Axis II: No diagnosis   Collaboration of Care: No additional collaboration of care for this session.     Patient/Guardian was advised Release of Information must be obtained prior to any record release in order to collaborate their care with an outside provider. Patient/Guardian was advised if they have not already done so to contact the registration department to sign all necessary forms in order for [place] to release information regarding their  care.    Consent: Patient/Guardian gives verbal consent for treatment and assignment of benefits for services provided during this visit. Patient/Guardian expressed understanding and agreed to proceed     I discussed the assessment and treatment plan with the patient. The patient was provided an opportunity to ask questions and all were  answered. The patient agreed with the plan and demonstrated an understanding of the instructions.   The patient was advised to call back or seek an in-person evaluation if the symptoms worsen or if the condition fails to improve as anticipated.   I provided 55 minutes of non-face-to-face time during this encounter.   [name], LCSW   06/14/2022

## 2022-06-19 ENCOUNTER — Ambulatory Visit (INDEPENDENT_AMBULATORY_CARE_PROVIDER_SITE_OTHER): Payer: BC Managed Care – PPO | Admitting: Clinical

## 2022-06-19 DIAGNOSIS — F331 Major depressive disorder, recurrent, moderate: Secondary | ICD-10-CM | POA: Diagnosis not present

## 2022-06-19 DIAGNOSIS — F419 Anxiety disorder, unspecified: Secondary | ICD-10-CM

## 2022-06-19 NOTE — Progress Notes (Signed)
IN PERSON  I connected with Shawn Riley on 06/19/22 at 10:00 AM EDT in person and verified that I am speaking with the correct person using two identifiers.  Location: Patient: Office Provider: Office   I discussed the limitations of evaluation and management by telemedicine and the availability of in person appointments. The patient expressed understanding and agreed to proceed.   Therapy Progress Note   Session Time: 10:00 AM-10:55 AM   Participation Level: Active   Behavioral Response: CasualAlertDepressed   Type of Therapy: Individual Therapy   Treatment Goals addressed: Coping   Interventions: CBT and Strength-based   Summary: Shawn Riley is a 31 y.o. male who presents with Depression with Anxiety. The OPT therapist worked with the patient for his ongoing OPT treatment. The OPT therapist utilized Motivational Interviewing to assist in creating therapeutic repore. The patient in the session was engaged and work in collaboration giving feedback about his triggers and symptoms over the past few weeks.The patient spoke about ongoing current situation with his pregnant ex-girlfriend.  The patient spoke about this realization that things are still very much in the air and that what is going to happen moving forward will have pieces that are in him control and parts that are not going to be in control taking things day by day.The OPT therapist utilized Cognitive Behavioral Therapy through cognitive restructuring as well as worked with the patient on coping strategies to assist in management of his mental health symptoms. The OPT therapist provided empathetic support allowing the patient to disclose his feelings . The OPT therapsit worked with the patient on management of his current stressors including working to not take on the weight of change in areas that are not in his control. The OPT therapist continued to work with the patient on his emotion/reactive behavior.    Suicidal/Homicidal: Nowithout intent/plan   Therapist Response: The OPT therapist worked with the patient for the patients scheduled session. The patient was engaged in his session and gave feedback in relation to triggers, symptoms, and behavior responses over the past few weeks.The OPT therapist worked with the patient utilizing an in session Cognitive Behavioral Therapy exercise. The patient reviewed with the OPT therapist coping skills to help manage his mood while going through the transition and working on reconciliation with his long term girlfriend . The patient in this session noted, " I am going to continue working on managing my emotions and reactions and trying to implement a pause when needed to and take each day one day at a time I realize its a process and I have ongoing work to do".The OPT therapist placed strong emphasis on the patient focusing on his basic health needs, using coping skills, and using his support network. The patient worked in session with the OPT therapist on exercise in my control vs out of my control.The OPT therapist will continue treatment work with the patient in his next scheduled session.   Plan: Return again in 2/3 weeks.   Diagnosis:      Axis I: Major depressive disorder, recurrent episode, moderate                           Axis II: No diagnosis   Collaboration of Care: No additional collaboration of care for this session.     Patient/Guardian was advised Release of Information must be obtained prior to any record release in order to collaborate their care with an outside provider. Patient/Guardian was advised  if they have not already done so to contact the registration department to sign all necessary forms in order for [place] to release information regarding their care.    Consent: Patient/Guardian gives verbal consent for treatment and assignment of benefits for services provided during this visit. Patient/Guardian expressed understanding and agreed to  proceed     I discussed the assessment and treatment plan with the patient. The patient was provided an opportunity to ask questions and all were answered. The patient agreed with the plan and demonstrated an understanding of the instructions.   The patient was advised to call back or seek an in-person evaluation if the symptoms worsen or if the condition fails to improve as anticipated.   I provided 55 minutes of face-to-face time during this encounter.   [name], LCSW   06/19/2022

## 2022-06-26 ENCOUNTER — Ambulatory Visit (INDEPENDENT_AMBULATORY_CARE_PROVIDER_SITE_OTHER): Payer: BC Managed Care – PPO | Admitting: Clinical

## 2022-06-26 DIAGNOSIS — F419 Anxiety disorder, unspecified: Secondary | ICD-10-CM

## 2022-06-26 DIAGNOSIS — F331 Major depressive disorder, recurrent, moderate: Secondary | ICD-10-CM | POA: Diagnosis not present

## 2022-06-26 NOTE — Progress Notes (Signed)
IN PERSON   I connected with Shawn Riley on 06/26/22 at 9:00 AM EDT in person and verified that I am speaking with the correct person using two identifiers.   Location: Patient: Office Provider: Office   I discussed the limitations of evaluation and management by telemedicine and the availability of in person appointments. The patient expressed understanding and agreed to proceed.     Therapy Progress Note   Session Time: 9:00 AM-9:30 AM   Participation Level: Active   Behavioral Response: CasualAlertDepressed   Type of Therapy: Individual Therapy   Treatment Goals addressed: Coping   Interventions: CBT and Strength-based   Summary: Shawn Riley is a 31 y.o. male who presents with Depression with Anxiety. The OPT therapist worked with the patient for his ongoing OPT treatment. The OPT therapist utilized Motivational Interviewing to assist in creating therapeutic repore. The patient in the session was engaged and work in collaboration giving feedback about his triggers and symptoms over the past few weeks.The patient spoke about ongoing current situation with his pregnant ex-girlfriend.  The patient spoke about this realization that things are still very much in the air and that what is going to happen moving forward will have pieces that are in him control and parts that are not going to be in control  continuing to work to take things day by day.The OPT therapist utilized Cognitive Behavioral Therapy through cognitive restructuring as well as worked with the patient on coping strategies to assist in management of his mental health symptoms. The OPT therapist provided empathetic support allowing the patient to disclose his feelings . The patient spoke about upcoming travel related to his job.The OPT therapsit worked with the patient on management of his current stressors including working to not take on the weight of change in areas that are not in his control.The patient verbalized  understanding of complexity moving forward with his partners pregnancy and her having a lease at another house. The patient is going for a job related event to [facility]. The OPT therapist continued to work with the patient on his emotion/reactive behavior.   Suicidal/Homicidal: Nowithout intent/plan   Therapist Response: The OPT therapist worked with the patient for the patients scheduled session. The patient was engaged in his session and gave feedback in relation to triggers, symptoms, and behavior responses over the past few weeks.The OPT therapist worked with the patient utilizing an in session Cognitive Behavioral Therapy exercise. The patient reviewed with the OPT therapist coping skills to help manage his mood while going through the transition and continuing to work on reconciliation with his long term girlfriend who is currently pregnant. The patient in this session noted, " I have realized as a guy I want to fix the problem with her lease and telling her family about her being pregnant, I realized I have to slow down and this is not in my control".The OPT therapist placed strong emphasis on the patient focusing on his basic health needs, using coping skills, and using his support network. The patient worked in session with the OPT therapist on exercise in my control vs out of my control.The OPT therapist will continue treatment work with the patient in his next scheduled session.   Plan: Return again in 2/3 weeks.   Diagnosis:      Axis I: Major depressive disorder, recurrent episode, moderate  Axis II: No diagnosis   Collaboration of Care: No additional collaboration of care for this session.     Patient/Guardian was advised Release of Information must be obtained prior to any record release in order to collaborate their care with an outside provider. Patient/Guardian was advised if they have not already done so to contact the registration department to sign all  necessary forms in order for [place] to release information regarding their care.    Consent: Patient/Guardian gives verbal consent for treatment and assignment of benefits for services provided during this visit. Patient/Guardian expressed understanding and agreed to proceed     I discussed the assessment and treatment plan with the patient. The patient was provided an opportunity to ask questions and all were answered. The patient agreed with the plan and demonstrated an understanding of the instructions.   The patient was advised to call back or seek an in-person evaluation if the symptoms worsen or if the condition fails to improve as anticipated.   I provided 30 minutes of face-to-face time during this encounter.   [name], LCSW   06/26/2022

## 2022-07-03 ENCOUNTER — Ambulatory Visit (INDEPENDENT_AMBULATORY_CARE_PROVIDER_SITE_OTHER): Payer: BC Managed Care – PPO | Admitting: Clinical

## 2022-07-03 DIAGNOSIS — F331 Major depressive disorder, recurrent, moderate: Secondary | ICD-10-CM

## 2022-07-03 DIAGNOSIS — F419 Anxiety disorder, unspecified: Secondary | ICD-10-CM

## 2022-07-03 NOTE — Progress Notes (Signed)
Virtual Visit via Telephone Note  I connected with Shawn Riley on 07/03/22 at  8:00 AM EDT by telephone and verified that I am speaking with the correct person using two identifiers.  Location: Patient: Home Provider: Office   I discussed the limitations, risks, security and privacy concerns of performing an evaluation and management service by telephone and the availability of in person appointments. I also discussed with the patient that there may be a patient responsible charge related to this service. The patient expressed understanding and agreed to proceed.  Therapy Progress Note   Session Time: 8:00 AM-8:30 AM   Participation Level: Active   Behavioral Response: CasualAlertDepressed   Type of Therapy: Individual Therapy   Treatment Goals addressed: Coping   Interventions: CBT and Strength-based   Summary: Shawn Riley is a 31 y.o. male who presents with Depression with Anxiety. The OPT therapist worked with the patient for his ongoing OPT treatment. The OPT therapist utilized Motivational Interviewing to assist in creating therapeutic repore. The patient in the session was engaged and work in collaboration giving feedback about his triggers and symptoms over the past few weeks.The patient spoke about ongoing current situation with his pregnant ex-girlfriend.  The patient spoke about this realization that things are still very much in the air and that what is going to happen moving forward will have pieces that are in his control and parts that are not going to be in control continuing to work to take things day by day.The OPT therapist utilized Cognitive Behavioral Therapy through cognitive restructuring as well as worked with the patient on coping strategies to assist in management of his mental health symptoms. The OPT therapist provided empathetic support allowing the patient to disclose his feelings . The patient spoke about recent and current travel related to his job.The OPT  therapsit worked with the patient on management of his current stressors including working to not take on the weight of change in areas that are not in his control.The patient verbalized understanding of complexity moving forward with his partners pregnancy and her having a lease at another house. The patient identified the next potential step is the patients partners upcoming 1st pregnancy doctor appointment. The OPT therapist continued to work with the patient on his emotion/reactive behavior.   Suicidal/Homicidal: Nowithout intent/plan   Therapist Response: The OPT therapist worked with the patient for the patients scheduled session. The patient was engaged in his session and gave feedback in relation to triggers, symptoms, and behavior responses over the past few weeks.The OPT therapist worked with the patient utilizing an in session Cognitive Behavioral Therapy exercise. The patient reviewed with the OPT therapist coping skills to help manage his mood while going through the transition and continuing to work on reconciliation with his long term girlfriend who is currently pregnant. The patient in this session noted, " I have to try to understand what she needs and how I can be a support and I have to just try to ask".The OPT therapist placed strong emphasis on the patient focusing on his basic health needs, using coping skills, and using his support network. The patient worked in session with the OPT therapist on exercise in my control vs out of my control.The OPT therapist will continue treatment work with the patient in his next scheduled session.   Plan: Return again in 2/3 weeks.   Diagnosis:      Axis I: Major depressive disorder, recurrent episode, moderate  Axis II: No diagnosis   Collaboration of Care: No additional collaboration of care for this session.     Patient/Guardian was advised Release of Information must be obtained prior to any record release in order  to collaborate their care with an outside provider. Patient/Guardian was advised if they have not already done so to contact the registration department to sign all necessary forms in order for [place] to release information regarding their care.    Consent: Patient/Guardian gives verbal consent for treatment and assignment of benefits for services provided during this visit. Patient/Guardian expressed understanding and agreed to proceed     I discussed the assessment and treatment plan with the patient. The patient was provided an opportunity to ask questions and all were answered. The patient agreed with the plan and demonstrated an understanding of the instructions.   The patient was advised to call back or seek an in-person evaluation if the symptoms worsen or if the condition fails to improve as anticipated.   I provided 30 minutes of non-face-to-face time during this encounter.   Lennox Grumbles, LCSW   07/03/2022

## 2022-07-08 ENCOUNTER — Ambulatory Visit (INDEPENDENT_AMBULATORY_CARE_PROVIDER_SITE_OTHER): Payer: BC Managed Care – PPO | Admitting: Clinical

## 2022-07-08 DIAGNOSIS — F331 Major depressive disorder, recurrent, moderate: Secondary | ICD-10-CM | POA: Diagnosis not present

## 2022-07-08 NOTE — Progress Notes (Signed)
Virtual Visit via Telephone Note   I connected with Shawn Riley on 07/08/22 at  8:00 AM EDT by telephone and verified that I am speaking with the correct person using two identifiers.   Location: Patient: Home Provider: Office   I discussed the limitations, risks, security and privacy concerns of performing an evaluation and management service by telephone and the availability of in person appointments. I also discussed with the patient that there may be a patient responsible charge related to this service. The patient expressed understanding and agreed to proceed.   Therapy Progress Note   Session Time: 8:00 AM-8:55 AM   Participation Level: Active   Behavioral Response: CasualAlertDepressed   Type of Therapy: Individual Therapy   Treatment Goals addressed: Coping   Interventions: CBT and Strength-based   Summary: Shawn Riley is a 31 y.o. male who presents with Depression with Anxiety. The OPT therapist worked with the patient for his ongoing OPT treatment. The OPT therapist utilized Motivational Interviewing to assist in creating therapeutic repore. The patient in the session was engaged and work in collaboration giving feedback about his triggers and symptoms over the past few weeks.The patient spoke about ongoing current situation with his pregnant ex-girlfriend.  The patient spoke about this realization that things are still very much in the air and that what is going to happen moving forward will have pieces that are in his control and parts that are not going to be in control continuing to work to take things day by day. The patient spoke about recent implementation of coping strategies to assist the patient in reconciling with his ex-girlfreind who has moved back in.The OPT therapist utilized Cognitive Behavioral Therapy through cognitive restructuring as well as worked with the patient on coping strategies to assist in management of his mental health symptoms. The OPT therapist  provided empathetic support allowing the patient to disclose his feelings . The patient spoke about stressors including upcoming baby doctor appointment which post this the patient and his girlfriend will be tackling what to do with the girlfriends lease on another home and telling her family about her pregnancy. The OPT therapist continued to work with the patient on his emotion/reactive behavior and self growth.   Suicidal/Homicidal: Nowithout intent/plan   Therapist Response: The OPT therapist worked with the patient for the patients scheduled session. The patient was engaged in his session and gave feedback in relation to triggers, symptoms, and behavior responses over the past few weeks.The OPT therapist worked with the patient utilizing an in session Cognitive Behavioral Therapy exercise. The patient reviewed with the OPT therapist coping skills to help manage his mood while going through the transition and continuing to work on reconciliation with his long term girlfriend who is currently pregnant. The patient in this session noted, " I have been using communication strategy and it honestly has been working great and feel great we have been able to move through smaller things a lot faster/easier than before".The OPT therapist placed strong emphasis on the patient focusing on his basic health needs, using coping skills, and using his support network. The patient worked in session with the OPT therapist on exercise in my control vs out of my control.The OPT therapist will continue treatment work with the patient in his next scheduled session.   Plan: Return again in 2/3 weeks.   Diagnosis:      Axis I: Major depressive disorder, recurrent episode, moderate  Axis II: No diagnosis   Collaboration of Care: No additional collaboration of care for this session.     Patient/Guardian was advised Release of Information must be obtained prior to any record release in order to  collaborate their care with an outside provider. Patient/Guardian was advised if they have not already done so to contact the registration department to sign all necessary forms in order for [place] to release information regarding their care.    Consent: Patient/Guardian gives verbal consent for treatment and assignment of benefits for services provided during this visit. Patient/Guardian expressed understanding and agreed to proceed     I discussed the assessment and treatment plan with the patient. The patient was provided an opportunity to ask questions and all were answered. The patient agreed with the plan and demonstrated an understanding of the instructions.   The patient was advised to call back or seek an in-person evaluation if the symptoms worsen or if the condition fails to improve as anticipated.   I provided 55 minutes of non-face-to-face time during this encounter.   Lennox Grumbles, LCSW   07/08/2022

## 2022-07-10 ENCOUNTER — Ambulatory Visit (HOSPITAL_BASED_OUTPATIENT_CLINIC_OR_DEPARTMENT_OTHER)
Admission: EM | Admit: 2022-07-10 | Discharge: 2022-07-10 | Disposition: A | Discharge status: Home / Self Care | Payer: BC Managed Care – PPO | Attending: Emergency Medicine | Admitting: Emergency Medicine

## 2022-07-10 ENCOUNTER — Other Ambulatory Visit: Payer: Self-pay

## 2022-07-10 ENCOUNTER — Ambulatory Visit
Admission: EM | Admit: 2022-07-10 | Discharge: 2022-07-10 | Disposition: A | Discharge status: Home / Self Care | Payer: BC Managed Care – PPO

## 2022-07-10 ENCOUNTER — Encounter (HOSPITAL_BASED_OUTPATIENT_CLINIC_OR_DEPARTMENT_OTHER): Payer: Self-pay | Admitting: Emergency Medicine

## 2022-07-10 ENCOUNTER — Emergency Department (HOSPITAL_COMMUNITY): Payer: BC Managed Care – PPO | Admitting: Anesthesiology

## 2022-07-10 ENCOUNTER — Encounter (HOSPITAL_COMMUNITY)
Admission: EM | Disposition: A | Discharge status: Home / Self Care | Payer: Self-pay | Source: Home / Self Care | Attending: Emergency Medicine

## 2022-07-10 ENCOUNTER — Encounter (HOSPITAL_COMMUNITY): Payer: Self-pay

## 2022-07-10 ENCOUNTER — Other Ambulatory Visit (HOSPITAL_BASED_OUTPATIENT_CLINIC_OR_DEPARTMENT_OTHER): Payer: Self-pay

## 2022-07-10 ENCOUNTER — Emergency Department (HOSPITAL_BASED_OUTPATIENT_CLINIC_OR_DEPARTMENT_OTHER): Payer: BC Managed Care – PPO

## 2022-07-10 ENCOUNTER — Encounter: Payer: Self-pay | Admitting: Emergency Medicine

## 2022-07-10 DIAGNOSIS — K3580 Unspecified acute appendicitis: Secondary | ICD-10-CM | POA: Diagnosis not present

## 2022-07-10 DIAGNOSIS — R1031 Right lower quadrant pain: Secondary | ICD-10-CM | POA: Diagnosis not present

## 2022-07-10 DIAGNOSIS — R6883 Chills (without fever): Secondary | ICD-10-CM | POA: Diagnosis not present

## 2022-07-10 HISTORY — PX: LAPAROSCOPIC APPENDECTOMY: SHX408

## 2022-07-10 LAB — COMPREHENSIVE METABOLIC PANEL
ALT: 50 U/L — ABNORMAL HIGH (ref 0–44)
AST: 26 U/L (ref 15–41)
Albumin: 4.8 g/dL (ref 3.5–5.0)
Alkaline Phosphatase: 69 U/L (ref 38–126)
Anion gap: 8 (ref 5–15)
BUN: 13 mg/dL (ref 6–20)
CO2: 28 mmol/L (ref 22–32)
Calcium: 9.4 mg/dL (ref 8.9–10.3)
Chloride: 101 mmol/L (ref 98–111)
Creatinine, Ser: 0.93 mg/dL (ref 0.61–1.24)
GFR, Estimated: >60 mL/min (ref >60)
Glucose, Bld: 98 mg/dL (ref 70–99)
Potassium: 4.1 mmol/L (ref 3.5–5.1)
Sodium: 137 mmol/L (ref 135–145)
Total Bilirubin: 0.8 mg/dL (ref 0.3–1.2)
Total Protein: 7.5 g/dL (ref 6.5–8.1)

## 2022-07-10 LAB — URINALYSIS, ROUTINE W REFLEX MICROSCOPIC
Bilirubin Urine: NEGATIVE
Glucose, UA: NEGATIVE mg/dL
Ketones, ur: NEGATIVE mg/dL
Leukocytes,Ua: NEGATIVE
Nitrite: NEGATIVE
Specific Gravity, Urine: 1.023 (ref 1.005–1.030)
pH: 5.5 (ref 5.0–8.0)

## 2022-07-10 LAB — CBC
HCT: 47.4 % (ref 39.0–52.0)
Hemoglobin: 16.3 g/dL (ref 13.0–17.0)
MCH: 30.4 pg (ref 26.0–34.0)
MCHC: 34.4 g/dL (ref 30.0–36.0)
MCV: 88.4 fL (ref 80.0–100.0)
Platelets: 236 10*3/uL (ref 150–400)
RBC: 5.36 MIL/uL (ref 4.22–5.81)
RDW: 12.1 % (ref 11.5–15.5)
WBC: 11.8 10*3/uL — ABNORMAL HIGH (ref 4.0–10.5)
nRBC: 0 % (ref 0.0–0.2)

## 2022-07-10 LAB — LIPASE, BLOOD: Lipase: 101 U/L — ABNORMAL HIGH (ref 11–51)

## 2022-07-10 SURGERY — APPENDECTOMY, LAPAROSCOPIC
Anesthesia: General | Site: Abdomen

## 2022-07-10 MED ORDER — PROPOFOL 10 MG/ML IV BOLUS
INTRAVENOUS | Status: AC
  Filled 2022-07-10: qty 20

## 2022-07-10 MED ORDER — BUPIVACAINE-EPINEPHRINE (PF) 0.25% -1:200000 IJ SOLN
INTRAMUSCULAR | Status: AC
  Filled 2022-07-10: qty 30

## 2022-07-10 MED ORDER — ONDANSETRON HCL 4 MG/2ML IJ SOLN
INTRAMUSCULAR | Status: AC
  Filled 2022-07-10: qty 2

## 2022-07-10 MED ORDER — CHLORHEXIDINE GLUCONATE 0.12 % MT SOLN
OROMUCOSAL | Status: AC
  Administered 2022-07-10: 15 mL via OROMUCOSAL
  Filled 2022-07-10: qty 15

## 2022-07-10 MED ORDER — OXYCODONE HCL 5 MG PO TABS
5.0000 mg | ORAL_TABLET | Freq: Four times a day (QID) | ORAL | 0 refills | Status: DC | PRN
  Filled 2022-07-10: qty 15, 4d supply, fill #0

## 2022-07-10 MED ORDER — SODIUM CHLORIDE 0.9 % IV SOLN
2.0000 g | Freq: Once | INTRAVENOUS | Status: AC
  Administered 2022-07-10: 2 g via INTRAVENOUS
  Filled 2022-07-10: qty 20

## 2022-07-10 MED ORDER — MIDAZOLAM HCL 2 MG/2ML IJ SOLN
INTRAMUSCULAR | Status: AC
  Filled 2022-07-10: qty 2

## 2022-07-10 MED ORDER — OXYCODONE HCL 5 MG PO TABS: 5.0000 mg | ORAL_TABLET | Freq: Four times a day (QID) | ORAL | 0 refills | Status: DC | PRN

## 2022-07-10 MED ORDER — ONDANSETRON HCL 4 MG/2ML IJ SOLN
INTRAMUSCULAR | Status: DC | PRN
  Administered 2022-07-10: 4 mg via INTRAVENOUS

## 2022-07-10 MED ORDER — ACETAMINOPHEN 500 MG PO TABS: 1000.0000 mg | ORAL_TABLET | Freq: Four times a day (QID) | ORAL | 2 refills | Status: AC | PRN

## 2022-07-10 MED ORDER — DEXMEDETOMIDINE HCL IN NACL 80 MCG/20ML IV SOLN
INTRAVENOUS | Status: AC
  Filled 2022-07-10: qty 20

## 2022-07-10 MED ORDER — HYDROMORPHONE HCL 1 MG/ML IJ SOLN: 0.2500 mg | INTRAMUSCULAR | Status: DC | PRN

## 2022-07-10 MED ORDER — METRONIDAZOLE 500 MG/100ML IV SOLN
500.0000 mg | Freq: Once | INTRAVENOUS | Status: AC
  Administered 2022-07-10: 500 mg via INTRAVENOUS
  Filled 2022-07-10: qty 100

## 2022-07-10 MED ORDER — OXYCODONE HCL 5 MG/5ML PO SOLN: 5.0000 mg | Freq: Once | ORAL | Status: AC | PRN

## 2022-07-10 MED ORDER — SUGAMMADEX SODIUM 200 MG/2ML IV SOLN
INTRAVENOUS | Status: DC | PRN
  Administered 2022-07-10: 200 mg via INTRAVENOUS

## 2022-07-10 MED ORDER — DEXAMETHASONE SODIUM PHOSPHATE 10 MG/ML IJ SOLN
INTRAMUSCULAR | Status: DC | PRN
  Administered 2022-07-10: 10 mg via INTRAVENOUS

## 2022-07-10 MED ORDER — SUCCINYLCHOLINE CHLORIDE 200 MG/10ML IV SOSY
PREFILLED_SYRINGE | INTRAVENOUS | Status: DC | PRN
  Administered 2022-07-10: 130 mg via INTRAVENOUS

## 2022-07-10 MED ORDER — OXYCODONE HCL 5 MG PO TABS
ORAL_TABLET | ORAL | Status: AC
  Administered 2022-07-10: 5 mg via ORAL
  Filled 2022-07-10: qty 1

## 2022-07-10 MED ORDER — FENTANYL CITRATE (PF) 250 MCG/5ML IJ SOLN
INTRAMUSCULAR | Status: AC
  Filled 2022-07-10: qty 5

## 2022-07-10 MED ORDER — CHLORHEXIDINE GLUCONATE 0.12 % MT SOLN: 15.0000 mL | Freq: Once | OROMUCOSAL | Status: AC

## 2022-07-10 MED ORDER — DEXAMETHASONE SODIUM PHOSPHATE 10 MG/ML IJ SOLN
INTRAMUSCULAR | Status: AC
  Filled 2022-07-10: qty 1

## 2022-07-10 MED ORDER — LACTATED RINGERS IV SOLN: INTRAVENOUS | Status: DC

## 2022-07-10 MED ORDER — ONDANSETRON HCL 4 MG/2ML IJ SOLN: 4.0000 mg | Freq: Once | INTRAMUSCULAR | Status: DC | PRN

## 2022-07-10 MED ORDER — OXYCODONE HCL 5 MG PO TABS: 5.0000 mg | ORAL_TABLET | Freq: Once | ORAL | Status: AC | PRN

## 2022-07-10 MED ORDER — LIDOCAINE 2% (20 MG/ML) 5 ML SYRINGE
INTRAMUSCULAR | Status: AC
  Filled 2022-07-10: qty 5

## 2022-07-10 MED ORDER — LIDOCAINE 2% (20 MG/ML) 5 ML SYRINGE
INTRAMUSCULAR | Status: DC | PRN
  Administered 2022-07-10: 100 mg via INTRAVENOUS

## 2022-07-10 MED ORDER — 0.9 % SODIUM CHLORIDE (POUR BTL) OPTIME
TOPICAL | Status: DC | PRN
  Administered 2022-07-10: 1000 mL

## 2022-07-10 MED ORDER — SUCCINYLCHOLINE CHLORIDE 200 MG/10ML IV SOSY
PREFILLED_SYRINGE | INTRAVENOUS | Status: AC
  Filled 2022-07-10: qty 10

## 2022-07-10 MED ORDER — SODIUM CHLORIDE 0.9 % IR SOLN
Status: DC | PRN
  Administered 2022-07-10: 1000 mL

## 2022-07-10 MED ORDER — IOHEXOL 300 MG/ML  SOLN
100.0000 mL | Freq: Once | INTRAMUSCULAR | Status: AC | PRN
  Administered 2022-07-10: 85 mL via INTRAVENOUS

## 2022-07-10 MED ORDER — PROPOFOL 10 MG/ML IV BOLUS
INTRAVENOUS | Status: DC | PRN
  Administered 2022-07-10: 200 mg via INTRAVENOUS

## 2022-07-10 MED ORDER — ROCURONIUM BROMIDE 10 MG/ML (PF) SYRINGE
PREFILLED_SYRINGE | INTRAVENOUS | Status: DC | PRN
  Administered 2022-07-10: 10 mg via INTRAVENOUS
  Administered 2022-07-10: 50 mg via INTRAVENOUS

## 2022-07-10 MED ORDER — DEXMEDETOMIDINE HCL IN NACL 80 MCG/20ML IV SOLN
INTRAVENOUS | Status: DC | PRN
  Administered 2022-07-10: 8 ug via BUCCAL

## 2022-07-10 MED ORDER — BUPIVACAINE-EPINEPHRINE 0.25% -1:200000 IJ SOLN
INTRAMUSCULAR | Status: DC | PRN
  Administered 2022-07-10: 30 mL

## 2022-07-10 MED ORDER — PHENYLEPHRINE HCL-NACL 20-0.9 MG/250ML-% IV SOLN
INTRAVENOUS | Status: DC | PRN
  Administered 2022-07-10: 20 ug/min via INTRAVENOUS

## 2022-07-10 MED ORDER — LORAZEPAM 2 MG/ML IJ SOLN
1.0000 mg | Freq: Once | INTRAMUSCULAR | Status: AC
  Administered 2022-07-10: 1 mg via INTRAVENOUS
  Filled 2022-07-10: qty 1

## 2022-07-10 MED ORDER — ORAL CARE MOUTH RINSE: 15.0000 mL | Freq: Once | OROMUCOSAL | Status: AC

## 2022-07-10 MED ORDER — IBUPROFEN 200 MG PO TABS: 400.0000 mg | ORAL_TABLET | Freq: Three times a day (TID) | ORAL | Status: AC | PRN

## 2022-07-10 MED ORDER — ROCURONIUM BROMIDE 10 MG/ML (PF) SYRINGE
PREFILLED_SYRINGE | INTRAVENOUS | Status: AC
  Filled 2022-07-10: qty 10

## 2022-07-10 MED ORDER — FENTANYL CITRATE (PF) 250 MCG/5ML IJ SOLN
INTRAMUSCULAR | Status: DC | PRN
  Administered 2022-07-10: 150 ug via INTRAVENOUS

## 2022-07-10 MED ORDER — MIDAZOLAM HCL 2 MG/2ML IJ SOLN
INTRAMUSCULAR | Status: DC | PRN
  Administered 2022-07-10: 2 mg via INTRAVENOUS

## 2022-07-10 SURGICAL SUPPLY — 43 items
ADH SKN CLS APL DERMABOND .7 (GAUZE/BANDAGES/DRESSINGS) ×1
ADH SKN CLS LQ APL DERMABOND (GAUZE/BANDAGES/DRESSINGS) ×1
APL PRP STRL LF DISP 70% ISPRP (MISCELLANEOUS) ×1
BAG COUNTER SPONGE SURGICOUNT (BAG) ×1 IMPLANT
BAG SPNG CNTER NS LX DISP (BAG) ×1
BLADE CLIPPER SURG (BLADE) IMPLANT
CANISTER SUCT 3000ML PPV (MISCELLANEOUS) ×1 IMPLANT
CHLORAPREP W/TINT 26 (MISCELLANEOUS) ×1 IMPLANT
COVER SURGICAL LIGHT HANDLE (MISCELLANEOUS) ×1 IMPLANT
CUTTER FLEX LINEAR 45M (STAPLE) IMPLANT
DERMABOND ADVANCED .7 DNX12 (GAUZE/BANDAGES/DRESSINGS) ×1 IMPLANT
DERMABOND ADVANCED .7 DNX6 (GAUZE/BANDAGES/DRESSINGS) IMPLANT
ELECT REM PT RETURN 9FT ADLT (ELECTROSURGICAL) ×1
ELECTRODE REM PT RTRN 9FT ADLT (ELECTROSURGICAL) ×1 IMPLANT
GLOVE BIOGEL PI IND STRL 7.0 (GLOVE) IMPLANT
GLOVE BIOGEL PI MICRO STRL 5.5 (GLOVE) ×1 IMPLANT
GLOVE INDICATOR 7.0 STRL GRN (GLOVE) IMPLANT
GLOVE SURG UNDER POLY LF SZ6 (GLOVE) ×1 IMPLANT
GOWN STRL REUS W/ TWL LRG LVL3 (GOWN DISPOSABLE) ×3 IMPLANT
GOWN STRL REUS W/TWL LRG LVL3 (GOWN DISPOSABLE) ×3
IRRIG SUCT STRYKERFLOW 2 WTIP (MISCELLANEOUS) ×1
IRRIGATION SUCT STRKRFLW 2 WTP (MISCELLANEOUS) IMPLANT
KIT BASIN OR (CUSTOM PROCEDURE TRAY) ×1 IMPLANT
KIT TURNOVER KIT B (KITS) ×1 IMPLANT
NS IRRIG 1000ML POUR BTL (IV SOLUTION) ×1 IMPLANT
PAD ARMBOARD 7.5X6 YLW CONV (MISCELLANEOUS) ×2 IMPLANT
PENCIL BUTTON HOLSTER BLD 10FT (ELECTRODE) ×1 IMPLANT
RELOAD STAPLE 45 3.5 BLU ETS (ENDOMECHANICALS) IMPLANT
RELOAD STAPLE TA45 3.5 REG BLU (ENDOMECHANICALS) ×1 IMPLANT
SCISSORS LAP 5X35 DISP (ENDOMECHANICALS) IMPLANT
SET TUBE SMOKE EVAC HIGH FLOW (TUBING) ×1 IMPLANT
SHEARS HARMONIC ACE PLUS 36CM (ENDOMECHANICALS) IMPLANT
SLEEVE Z-THREAD 5X100MM (TROCAR) IMPLANT
SPECIMEN JAR SMALL (MISCELLANEOUS) ×1 IMPLANT
SUT MNCRL AB 4-0 PS2 18 (SUTURE) ×1 IMPLANT
SYS BAG RETRIEVAL 10MM (BASKET) ×1
SYSTEM BAG RETRIEVAL 10MM (BASKET) IMPLANT
TOWEL GREEN STERILE (TOWEL DISPOSABLE) ×1 IMPLANT
TOWEL GREEN STERILE FF (TOWEL DISPOSABLE) ×1 IMPLANT
TRAY LAPAROSCOPIC MC (CUSTOM PROCEDURE TRAY) ×1 IMPLANT
TROCAR BALLN 12MMX100 BLUNT (TROCAR) IMPLANT
TROCAR Z-THREAD OPTICAL 5X100M (TROCAR) ×1 IMPLANT
WARMER LAPAROSCOPE (MISCELLANEOUS) ×1 IMPLANT

## 2022-07-10 NOTE — Anesthesia Preprocedure Evaluation (Addendum)
Anesthesia Evaluation  Patient identified by MRN, date of birth, ID band Patient awake    Reviewed: Allergy & Precautions, NPO status , Patient's Chart, lab work & pertinent test results  Airway Mallampati: III  TM Distance: >3 FB Neck ROM: Full  Mouth opening: Limited Mouth Opening  Dental no notable dental hx. (+) Teeth Intact, Dental Advisory Given   Pulmonary neg pulmonary ROS,    Pulmonary exam normal breath sounds clear to auscultation       Cardiovascular negative cardio ROS Normal cardiovascular exam Rhythm:Regular Rate:Normal     Neuro/Psych negative neurological ROS  negative psych ROS   GI/Hepatic Neg liver ROS, Acute appendicitis   Endo/Other  negative endocrine ROS  Renal/GU negative Renal ROS  negative genitourinary   Musculoskeletal negative musculoskeletal ROS (+)   Abdominal (+)  Abdomen: tender.  RLQ tenderness  Peds  Hematology negative hematology ROS (+)   Anesthesia Other Findings   Reproductive/Obstetrics                            Anesthesia Physical Anesthesia Plan  ASA: 1 and emergent  Anesthesia Plan: General   Post-op Pain Management:    Induction: Intravenous and Cricoid pressure planned  PONV Risk Score and Plan: 4 or greater and Treatment may vary due to age or medical condition, Midazolam, Dexamethasone and Ondansetron  Airway Management Planned: Oral ETT  Additional Equipment: None  Intra-op Plan:   Post-operative Plan: Extubation in OR  Informed Consent: I have reviewed the patients History and Physical, chart, labs and discussed the procedure including the risks, benefits and alternatives for the proposed anesthesia with the patient or authorized representative who has indicated his/her understanding and acceptance.     Dental advisory given  Plan Discussed with: CRNA and Anesthesiologist  Anesthesia Plan Comments:          Anesthesia Quick Evaluation

## 2022-07-10 NOTE — Discharge Instructions (Signed)
[facility] at [address] Address: [address], [place], [place] [address] Hours:  Open 24 hours Phone: [phone] 

## 2022-07-10 NOTE — Anesthesia Postprocedure Evaluation (Signed)
Anesthesia Post Note  Patient: Shawn Riley  Procedure(s) Performed: APPENDECTOMY LAPAROSCOPIC (Abdomen)     Patient location during evaluation: PACU Anesthesia Type: General Level of consciousness: awake and alert and oriented Pain management: pain level controlled Vital Signs Assessment: post-procedure vital signs reviewed and stable Respiratory status: spontaneous breathing, nonlabored ventilation and respiratory function stable Cardiovascular status: blood pressure returned to baseline and stable Postop Assessment: no apparent nausea or vomiting Anesthetic complications: no   No notable events documented.  Last Vitals:  Vitals:   07/10/22 1635 07/10/22 1645  BP: (!) 145/87 130/73  Pulse: (!) 101 95  Resp: 10 19  Temp: 36.6 C   SpO2: 96% 96%    Last Pain:  Vitals:   07/10/22 1645  TempSrc:   PainSc: Asleep                 Shakea Isip,Graylin A.

## 2022-07-10 NOTE — ED Notes (Signed)
Last food intake - Last night Last Fluid intake - water 0700 this morning

## 2022-07-10 NOTE — ED Notes (Signed)
Pt left CAOx4. Pain still 7-10. Right abdomen.

## 2022-07-10 NOTE — Discharge Instructions (Signed)
[facility] LAPAROSCOPIC SURGERY: POST OP INSTRUCTIONS Always review your discharge instruction sheet given to you by the facility where your surgery was performed. IF YOU HAVE DISABILITY OR FAMILY LEAVE FORMS, YOU MUST BRING THEM TO THE OFFICE FOR PROCESSING.   DO NOT GIVE THEM TO YOUR DOCTOR.  PAIN CONTROL  First take acetaminophen (Tylenol) AND/or ibuprofen (Advil) to control your pain after surgery.  Follow directions on package.  Taking acetaminophen (Tylenol) and/or ibuprofen (Advil) regularly after surgery will help to control your pain and lower the amount of prescription pain medication you may need.  You should not take more than 3,000 mg (3 grams) of acetaminophen (Tylenol) in 24 hours.  You should not take ibuprofen (Advil), aleve, motrin, naprosyn or other NSAIDS if you have a history of stomach ulcers or chronic kidney disease.  A prescription for pain medication may be given to you upon discharge.  Take your pain medication as prescribed, if you still have uncontrolled pain after taking acetaminophen (Tylenol) or ibuprofen (Advil). Use ice packs to help control pain. If you need a refill on your pain medication, please contact your pharmacy.  They will contact our office to request authorization. Prescriptions will not be filled after 5pm or on week-ends.  HOME MEDICATIONS Take your usually prescribed medications unless otherwise directed.  DIET You should follow a light diet the first few days after arrival home.  Be sure to include lots of fluids daily. Avoid fatty, fried foods.   CONSTIPATION It is common to experience some constipation after surgery and if you are taking pain medication.  Increasing fluid intake and taking a stool softener (such as Colace) will usually help or prevent this problem from occurring.  A mild laxative (Milk of Magnesia or Miralax) should be taken according to package instructions if there are no bowel movements after 48  hours.  WOUND/INCISION CARE Most patients will experience some swelling and bruising in the area of the incisions.  Ice packs will help.  Swelling and bruising can take several days to resolve.  Unless discharge instructions indicate otherwise, follow guidelines below  STERI-STRIPS - you may remove your outer bandages 48 hours after surgery, and you may shower at that time.  You have steri-strips (small skin tapes) in place directly over the incision.  These strips should be left on the skin for 7-10 days.   DERMABOND/SKIN GLUE - you may shower in 24 hours.  The glue will flake off over the next 2-3 weeks. Any sutures or staples will be removed at the office during your follow-up visit.  ACTIVITIES You may resume regular (light) daily activities beginning the next day--such as daily self-care, walking, climbing stairs--gradually increasing activities as tolerated.  You may have sexual intercourse when it is comfortable.  Refrain from any heavy lifting or straining until approved by your doctor. You may drive when you are no longer taking prescription pain medication, you can comfortably wear a seatbelt, and you can safely maneuver your car and apply brakes.  FOLLOW-UP You should see your doctor in the office for a follow-up appointment approximately 2-3 weeks after your surgery.  You should have been given your post-op/follow-up appointment when your surgery was scheduled.  If you did not receive a post-op/follow-up appointment, make sure that you call for this appointment within a day or two after you arrive home to insure a convenient appointment time.   WHEN TO CALL YOUR DOCTOR: Fever over 101.0 Inability to urinate Continued bleeding from incision.   Increased pain, redness, or drainage from the incision. Increasing abdominal pain  The clinic staff is available to answer your questions during regular business hours.  Please don't hesitate to call and ask to speak to one of the nurses for  clinical concerns.  If you have a medical emergency, go to the nearest emergency room or call 911.  A surgeon from [facility] is always on call at the hospital. [address], [address], [place], [place]  [address] ? [address] [address], , [place]   [address] [phone] ? [phone] ? FAX [phone] Web site: [email or web address]  

## 2022-07-10 NOTE — ED Triage Notes (Addendum)
Pt reports RLQ abdominal pain, intermittent chills/nausea since last night. Pt reports pain radiates to periumbilical region and reports increase in pain/soreness with position changes.

## 2022-07-10 NOTE — ED Notes (Signed)
Patient is being discharged from the Urgent Care and sent to the Emergency Department via POV . Per PA, patient is in need of higher level of care due to possible appendicitis. Patient is aware and verbalizes understanding of plan of care.  Vitals:   07/10/22 0814  BP: 133/86  Pulse: 75  Resp: 20  Temp: 98.3 F (36.8 C)  SpO2: 96%

## 2022-07-10 NOTE — Anesthesia Procedure Notes (Signed)
Procedure Name: Intubation Date/Time: 07/10/2022 3:34 PM  Performed by: Reece Agar, CRNAPre-anesthesia Checklist: Patient identified, Emergency Drugs available, Suction available and Patient being monitored Patient Re-evaluated:Patient Re-evaluated prior to induction Oxygen Delivery Method: Circle System Utilized Preoxygenation: Pre-oxygenation with 100% oxygen Induction Type: IV induction, Rapid sequence and Cricoid Pressure applied Laryngoscope Size: Mac and 4 Grade View: Grade I Tube type: Oral Tube size: 7.5 mm Number of attempts: 1 Airway Equipment and Method: Stylet Placement Confirmation: ETT inserted through vocal cords under direct vision, positive ETCO2 and breath sounds checked- equal and bilateral Secured at: 23 cm Tube secured with: Tape Dental Injury: Teeth and Oropharynx as per pre-operative assessment  Comments: Pt vomiting when entering OR. Zofran given and RSI performed. OGT placed and stomach decompressed.

## 2022-07-10 NOTE — Op Note (Signed)
Date: 07/10/22  Patient: Shawn Riley MRN: [id number]  Preoperative Diagnosis: Acute appendicitis without perforation Postoperative Diagnosis: Same  Procedure: Laparoscopic appendectomy  Surgeon: Michaelle Birks, MD  EBL: Minimal  Anesthesia: General endotracheal  Specimens: Appendix  Indications: Mr. Smoker is a 32 yo male who presented to the ED with one day of periumbilical abdominal pain that migrated to the RLQ. CT scan was consistent with acute appendicitis, with no signs of abscess or perforation. Appendectomy was recommended and the patient consented to proceed with surgery.  Findings: Early acute appendicitis without perforation.  Procedure details: Informed consent was obtained in the preoperative area prior to the procedure. The patient was brought to the operating room and placed on the table in the supine position. General anesthesia was induced and appropriate lines and drains were placed for intraoperative monitoring. Perioperative antibiotics were administered per SCIP guidelines. The abdomen was prepped and draped in the usual sterile fashion. A pre-procedure timeout was taken verifying patient identity, surgical site and procedure to be performed.  A small infraumbilical skin incision was made and the subcutaneous tissue was spread to expose the fascia. The umbilical stalk was grasped and elevated, and the fascia was sharply incised. The peritoneal cavity was visualized and a 62mm Hasson trocar was inserted. The abdomen was inspected with no evidence of visceral or vascular injury. A suprapubic 31mm port was placed, taking care to avoid injury to the bladder, followed by a 60mm port in the LLQ, both under direct visualization. The patient was placed in Trendelenberg with the right side elevated, and the small bowel was swept to the left to expose the cecum. The appendix was identified in the RLQ, attached to the abdominal sidewall. It appeared mildly erythematous and dilated,  consistent with early appendicitis. The retroperitoneal attachments were taken down with Harmonic shears. The mesoappendix was divided with harmonic shears, starting at the tip and working toward the base of the appendix. The appendix was then divided at the base from the cecum using a 38mm stapler with a blue load. Prior to firing the stapler, the terminal ileum was identified and it was clear that the stapler was not obstructing the ileocecal valve. The specimen was placed in an endocatch bag. The surgical site was irrigated. The staple line was inspected and appeared in tact with no bleeding or leakage. The ports were removed and the pneumoperitoneum was evacuated. The specimen was extracted via the umbilical port site and sent for routine pathology. The umbilical port site fascia was closed with a 0 Vicryl suture. The skin at all port sites was closed with 4-0 monocryl subcuticular suture. Dermabond was applied.  The patient tolerated the procedure well with no apparent complications. All counts were correct x2 at the end of the procedure. The patient was extubated and taken to PACU in stable condition.  Michaelle Birks, MD 07/10/22 4:26 PM

## 2022-07-10 NOTE — Transfer of Care (Signed)
Immediate Anesthesia Transfer of Care Note  Patient: Shawn Riley  Procedure(s) Performed: APPENDECTOMY LAPAROSCOPIC (Abdomen)  Patient Location: PACU  Anesthesia Type:General  Level of Consciousness: drowsy  Airway & Oxygen Therapy: Patient Spontanous Breathing and Patient connected to face mask oxygen  Post-op Assessment: Report given to RN and Post -op Vital signs reviewed and stable  Post vital signs: Reviewed and stable  Last Vitals:  Vitals Value Taken Time  BP 145/87 07/10/22 1636  Temp    Pulse 98 07/10/22 1639  Resp 19 07/10/22 1639  SpO2 96 % 07/10/22 1639  Vitals shown include unvalidated device data.  Last Pain:  Vitals:   07/10/22 1635  TempSrc:   PainSc: Asleep         Complications: No notable events documented.

## 2022-07-10 NOTE — ED Notes (Signed)
Report given to [facility]. 

## 2022-07-10 NOTE — ED Notes (Signed)
Report given to Short Stay. 

## 2022-07-10 NOTE — ED Triage Notes (Signed)
Pt c/o RLQ since yesterday. Seen at [facility], sent to r/o appendicitis. Endorses nausea.

## 2022-07-10 NOTE — H&P (Signed)
Shawn Riley 11/10/1990  [id number].    Requesting MD: Dr. Cristal Deer Tegeler Chief Complaint/Reason for Consult: Acute Appendicitis  HPI: Shawn Riley is a 31 y.o. male with no significant past medical hx who presented to [facility] ED for abdominal pain. Patient began having periumbilical abdominal pain last night around 9pm. The pain has been constant since onset but since migrated to his RLQ. He notes associated nausea and had vomiting this afternoon. Denies fever, chills, decreased appetite, diarrhea, or constipation. No hx of similar pain in the past. In the ED he was afebrile without tachycardia or hypotension. WBC 11.8, Lipase 101. CT A/P showed acute uncomplicated appendicitis. He was started on abx and transferred to [facility] for evaluation. He is not on any blood thinners. He denies prior abdominal surgeries.   ROS: Pertinent positives noted in HPI.  History reviewed. No pertinent family history.  History reviewed. No pertinent past medical history.  History reviewed. No pertinent surgical history.  Social History:  reports that he has never smoked. He has quit using smokeless tobacco. He reports current alcohol use. No history on file for drug use.  Allergies: No Known Allergies  Medications Prior to Admission  Medication Sig Dispense Refill   diclofenac (VOLTAREN) 75 MG EC tablet Take 1 tablet (75 mg total) by mouth 2 (two) times daily as needed. (Patient not taking: Reported on 07/10/2022) 60 tablet 0   HYDROcodone-acetaminophen (NORCO/VICODIN) 5-325 MG per tablet Take 1 tablet by mouth every 6 (six) hours as needed. (Patient not taking: Reported on 07/10/2022) 30 tablet 0   naproxen (NAPROSYN) 375 MG tablet Take 1 tablet (375 mg total) by mouth 2 (two) times daily. (Patient not taking: Reported on 07/10/2022) 20 tablet 0   omeprazole (PRILOSEC) 20 MG capsule Take 1 capsule (20 mg total) by mouth daily. (Patient not taking: Reported on 07/10/2022) 30 capsule 0   phenazopyridine  (PYRIDIUM) 100 MG tablet Take 1 tablet (100 mg total) by mouth 3 (three) times daily as needed for pain. (Patient not taking: Reported on 07/10/2022) 10 tablet 0      Physical Exam: Blood pressure (!) 145/95, pulse 89, temperature 97.9 F (36.6 C), temperature source Oral, resp. rate 18, height 5\' 9"  (1.753 m), weight 88.5 kg, SpO2 93 %. General: resting in bed, appears uncomfortable Neurological: alert, no focal deficits HEENT: normocephalic, atraumatic, oropharynx clear, no scleral icterus Respiratory: normal work of breathing on room air Abdomen: soft, nondistended, focally tender to palpation in the RLQ. Extremities: warm and well-perfused, no deformities, moving all extremities spontaneously Psychiatric: normal mood and affect Skin: warm and dry, no jaundice, no rashes or lesions   Results for orders placed or performed during the hospital encounter of 07/10/22 (from the past 48 hour(s))  Lipase, blood     Status: Abnormal   Collection Time: 07/10/22  9:49 AM  Result Value Ref Range   Lipase 101 (H) 11 - 51 U/L    Comment: Performed at 07/12/22, [address] [place], [place]  Comprehensive metabolic panel     Status: Abnormal   Collection Time: 07/10/22  9:49 AM  Result Value Ref Range   Sodium 137 135 - 145 mmol/L   Potassium 4.1 3.5 - 5.1 mmol/L   Chloride 101 98 - 111 mmol/L   CO2 28 22 - 32 mmol/L   Glucose, Bld 98 70 - 99 mg/dL    Comment: Glucose reference range applies only to samples taken after fasting for at least 8 hours.  BUN 13 6 - 20 mg/dL   Creatinine, Ser 0.93 0.61 - 1.24 mg/dL   Calcium 9.4 8.9 - 10.3 mg/dL   Total Protein 7.5 6.5 - 8.1 g/dL   Albumin 4.8 3.5 - 5.0 g/dL   AST 26 15 - 41 U/L   ALT 50 (H) 0 - 44 U/L   Alkaline Phosphatase 69 38 - 126 U/L   Total Bilirubin 0.8 0.3 - 1.2 mg/dL   GFR, Estimated >60 >60 mL/min    Comment: (NOTE) Calculated using the CKD-EPI Creatinine Equation (2021)    Anion gap 8 5 -  15    Comment: Performed at [facility], [address], [place], [place] [address]  CBC     Status: Abnormal   Collection Time: 07/10/22  9:49 AM  Result Value Ref Range   WBC 11.8 (H) 4.0 - 10.5 K/uL   RBC 5.36 4.22 - 5.81 MIL/uL   Hemoglobin 16.3 13.0 - 17.0 g/dL   HCT 47.4 39.0 - 52.0 %   MCV 88.4 80.0 - 100.0 fL   MCH 30.4 26.0 - 34.0 pg   MCHC 34.4 30.0 - 36.0 g/dL   RDW 12.1 11.5 - 15.5 %   Platelets 236 150 - 400 K/uL   nRBC 0.0 0.0 - 0.2 %    Comment: Performed at [facility], [address], [place], [place] [address]  Urinalysis, Routine w reflex microscopic Urine, Clean Catch     Status: Abnormal   Collection Time: 07/10/22  9:49 AM  Result Value Ref Range   Color, Urine YELLOW YELLOW   APPearance CLEAR CLEAR   Specific Gravity, Urine 1.023 1.005 - 1.030   pH 5.5 5.0 - 8.0   Glucose, UA NEGATIVE NEGATIVE mg/dL   Hgb urine dipstick TRACE (A) NEGATIVE   Bilirubin Urine NEGATIVE NEGATIVE   Ketones, ur NEGATIVE NEGATIVE mg/dL   Protein, ur TRACE (A) NEGATIVE mg/dL   Nitrite NEGATIVE NEGATIVE   Leukocytes,Ua NEGATIVE NEGATIVE   RBC / HPF 0-5 0 - 5 RBC/hpf   WBC, UA 0-5 0 - 5 WBC/hpf   Bacteria, UA RARE (A) NONE SEEN   Mucus PRESENT     Comment: Performed at [facility], [address], [place], [place] [address]   CT ABDOMEN PELVIS W CONTRAST  Result Date: 07/10/2022 CLINICAL DATA:  Right lower quadrant abdominal pain. EXAM: CT ABDOMEN AND PELVIS WITH CONTRAST TECHNIQUE: Multidetector CT imaging of the abdomen and pelvis was performed using the standard protocol following bolus administration of intravenous contrast. RADIATION DOSE REDUCTION: This exam was performed according to the departmental dose-optimization program which includes automated exposure control, adjustment of the mA and/or kV according to patient size and/or use of iterative reconstruction technique. CONTRAST:  78mL OMNIPAQUE IOHEXOL 300  MG/ML  SOLN COMPARISON:  None Available. FINDINGS: Lower chest: No acute abnormality. Hepatobiliary: No suspicious hepatic lesion. Gallbladder is unremarkable. No biliary ductal dilation. Pancreas: No pancreatic ductal dilation or evidence of acute inflammation. Spleen: No splenomegaly or focal splenic lesion. Adrenals/Urinary Tract: Bilateral adrenal glands appear normal. No hydronephrosis. No renal, ureteral or bladder calculi identified. Urinary bladder is unremarkable for degree of distension. Stomach/Bowel: No radiopaque enteric contrast material was administered. Stomach is unremarkable for degree of distension. No pathologic dilation of small or large bowel. Colonic diverticulosis without findings of acute diverticulitis. Dilated inflamed appendix measuring up to 8 mm with adjacent fat stranding in the right lower quadrant. No walled off fluid collections. Vascular/Lymphatic: Normal caliber abdominal aorta. No pathologically  enlarged abdominal or pelvic lymph nodes. Reproductive: Prostate is unremarkable. Other: No walled off fluid collections.  No pneumoperitoneum. Musculoskeletal: No acute osseous abnormality. 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 IMPRESSION: 1. Acute uncomplicated appendicitis. 2. Colonic diverticulosis without findings of acute diverticulitis. Electronically Signed   By: Maudry Mayhew M.D.   On: 07/10/2022 12:31    Anti-infectives (From admission, onward)    Start     Dose/Rate Route Frequency Ordered Stop   07/10/22 1330  cefTRIAXone (ROCEPHIN) 2 g in sodium chloride 0.9 % 100 mL IVPB       See Hyperspace for full Linked Orders Report.   2 g 200 mL/hr over 30 Minutes Intravenous  Once 07/10/22 1324 07/10/22 1439   07/10/22 1330  metroNIDAZOLE (FLAGYL) IVPB 500 mg       See Hyperspace for full Linked Orders Report.   500 mg 100 mL/hr over 60 Minutes Intravenous  Once 07/10/22 1324 07/10/22 1506       Assessment/Plan Acute Appendicitis History, exam and  imaging consistent with acute appendicitis. No evidence of perforation or abscess on CT. I recommended laparoscopic appendectomy and reviewed the details of this procedure with the patient. He expressed understanding and agrees to proceed with surgery. - Antibiotics given in ED (Rocephin/Flagyl) - Keep NPO for surgery - IV fluid hydration - Anticipate discharge home from PACU  Sophronia Simas, MD [facility] General, Hepatobiliary and Pancreatic Surgery 07/10/22 3:12 PM

## 2022-07-10 NOTE — ED Provider Notes (Signed)
[facility] EMERGENCY DEPT Provider Note   CSN: [id number] Arrival date & time: 07/10/22  7619     History  Chief Complaint  Patient presents with   Abdominal Pain    Shawn Riley is a 31 y.o. male.  The history is provided by the patient and medical records. No language interpreter was used.  Abdominal Pain Pain location:  RLQ Pain quality: aching   Pain radiates to:  Epigastric region Pain severity:  Moderate Onset quality:  Gradual Duration:  2 days Timing:  Constant Progression:  Worsening Chronicity:  New Context: not previous surgeries   Relieved by:  Nothing Worsened by:  Nothing Ineffective treatments:  None tried Associated symptoms: chills and nausea   Associated symptoms: no chest pain, no constipation, no cough, no diarrhea, no dysuria, no fatigue, no fever, no shortness of breath and no vomiting        Home Medications Prior to Admission medications   Medication Sig Start Date End Date Taking? Authorizing Provider  diclofenac (VOLTAREN) 75 MG EC tablet Take 1 tablet (75 mg total) by mouth 2 (two) times daily as needed. 11/26/14   Rodolph Bong, MD  HYDROcodone-acetaminophen (NORCO/VICODIN) 5-325 MG per tablet Take 1 tablet by mouth every 6 (six) hours as needed. 11/26/14   Rodolph Bong, MD  naproxen (NAPROSYN) 375 MG tablet Take 1 tablet (375 mg total) by mouth 2 (two) times daily. 06/18/18   Georgetta Haber, NP  omeprazole (PRILOSEC) 20 MG capsule Take 1 capsule (20 mg total) by mouth daily. 06/18/18   Georgetta Haber, NP  phenazopyridine (PYRIDIUM) 100 MG tablet Take 1 tablet (100 mg total) by mouth 3 (three) times daily as needed for pain. 12/18/20   Durward Parcel, FNP      Allergies    Patient has no known allergies.    Review of Systems   Review of Systems  Constitutional:  Positive for chills. Negative for fatigue and fever.  HENT:  Negative for congestion.   Respiratory:  Negative for cough, chest tightness and shortness of  breath.   Cardiovascular:  Negative for chest pain.  Gastrointestinal:  Positive for abdominal pain and nausea. Negative for constipation, diarrhea and vomiting.  Genitourinary:  Negative for dysuria, flank pain and frequency.  Musculoskeletal:  Negative for back pain and neck pain.  Neurological:  Negative for light-headedness and headaches.  Psychiatric/Behavioral:  Negative for confusion.   All other systems reviewed and are negative.   Physical Exam Updated Vital Signs BP (!) 149/79 (BP Location: Right Arm)   Pulse 86   Temp 98.3 F (36.8 C)   Resp 16   Ht 5\' 9"  (1.753 m)   Wt 88.5 kg   SpO2 97%   BMI 28.80 kg/m  Physical Exam Vitals and nursing note reviewed.  Constitutional:      General: He is not in acute distress.    Appearance: He is well-developed. He is not ill-appearing, toxic-appearing or diaphoretic.  HENT:     Head: Normocephalic and atraumatic.  Eyes:     Conjunctiva/sclera: Conjunctivae normal.  Cardiovascular:     Rate and Rhythm: Normal rate and regular rhythm.     Heart sounds: No murmur heard. Pulmonary:     Effort: Pulmonary effort is normal. No respiratory distress.     Breath sounds: Normal breath sounds. No wheezing, rhonchi or rales.  Chest:     Chest wall: No tenderness.  Abdominal:     General: Abdomen is flat. There is  no distension.     Palpations: Abdomen is soft.     Tenderness: There is abdominal tenderness in the right lower quadrant and periumbilical area. There is no right CVA tenderness or left CVA tenderness.     Hernia: No hernia is present.  Musculoskeletal:        General: No swelling.     Cervical back: Neck supple.  Skin:    General: Skin is warm and dry.     Capillary Refill: Capillary refill takes less than 2 seconds.  Neurological:     General: No focal deficit present.     Mental Status: He is alert.  Psychiatric:        Mood and Affect: Mood normal.     ED Results / Procedures / Treatments   Labs (all labs  ordered are listed, but only abnormal results are displayed) Labs Reviewed  LIPASE, BLOOD - Abnormal; Notable for the following components:      Result Value   Lipase 101 (*)    All other components within normal limits  COMPREHENSIVE METABOLIC PANEL - Abnormal; Notable for the following components:   ALT 50 (*)    All other components within normal limits  CBC - Abnormal; Notable for the following components:   WBC 11.8 (*)    All other components within normal limits  URINALYSIS, ROUTINE W REFLEX MICROSCOPIC - Abnormal; Notable for the following components:   Hgb urine dipstick TRACE (*)    Protein, ur TRACE (*)    Bacteria, UA RARE (*)    All other components within normal limits    EKG None  Radiology CT ABDOMEN PELVIS W CONTRAST  Result Date: 07/10/2022 CLINICAL DATA:  Right lower quadrant abdominal pain. EXAM: CT ABDOMEN AND PELVIS WITH CONTRAST TECHNIQUE: Multidetector CT imaging of the abdomen and pelvis was performed using the standard protocol following bolus administration of intravenous contrast. RADIATION DOSE REDUCTION: This exam was performed according to the departmental dose-optimization program which includes automated exposure control, adjustment of the mA and/or kV according to patient size and/or use of iterative reconstruction technique. CONTRAST:  42mL OMNIPAQUE IOHEXOL 300 MG/ML  SOLN COMPARISON:  None Available. FINDINGS: Lower chest: No acute abnormality. Hepatobiliary: No suspicious hepatic lesion. Gallbladder is unremarkable. No biliary ductal dilation. Pancreas: No pancreatic ductal dilation or evidence of acute inflammation. Spleen: No splenomegaly or focal splenic lesion. Adrenals/Urinary Tract: Bilateral adrenal glands appear normal. No hydronephrosis. No renal, ureteral or bladder calculi identified. Urinary bladder is unremarkable for degree of distension. Stomach/Bowel: No radiopaque enteric contrast material was administered. Stomach is unremarkable for  degree of distension. No pathologic dilation of small or large bowel. Colonic diverticulosis without findings of acute diverticulitis. Dilated inflamed appendix measuring up to 8 mm with adjacent fat stranding in the right lower quadrant. No walled off fluid collections. Vascular/Lymphatic: Normal caliber abdominal aorta. No pathologically enlarged abdominal or pelvic lymph nodes. Reproductive: Prostate is unremarkable. Other: No walled off fluid collections.  No pneumoperitoneum. Musculoskeletal: No acute osseous abnormality. 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 IMPRESSION: 1. Acute uncomplicated appendicitis. 2. Colonic diverticulosis without findings of acute diverticulitis. Electronically Signed   By: Maudry Mayhew M.D.   On: 07/10/2022 12:31    Procedures Procedures    CRITICAL CARE Performed by: Canary Brim Miracle Criado Total critical care time: 30 minutes Critical care time was exclusive of separately billable procedures and treating other patients. Critical care was necessary to treat or prevent imminent or life-threatening deterioration. Critical care was time  spent personally by me on the following activities: development of treatment plan with patient and/or surrogate as well as nursing, discussions with consultants, evaluation of patient's response to treatment, examination of patient, obtaining history from patient or surrogate, ordering and performing treatments and interventions, ordering and review of laboratory studies, ordering and review of radiographic studies, pulse oximetry and re-evaluation of patient's condition.   Medications Ordered in ED Medications  cefTRIAXone (ROCEPHIN) 2 g in sodium chloride 0.9 % 100 mL IVPB (has no administration in time range)    And  metroNIDAZOLE (FLAGYL) IVPB 500 mg (has no administration in time range)  LORazepam (ATIVAN) injection 1 mg (has no administration in time range)  iohexol (OMNIPAQUE) 300 MG/ML solution 100 mL (85 mLs  Intravenous Contrast Given 07/10/22 1214)    ED Course/ Medical Decision Making/ A&P                           Medical Decision Making Amount and/or Complexity of Data Reviewed Labs: ordered. Radiology: ordered.  Risk Prescription drug management. Decision regarding hospitalization.    Shawn Riley is a 31 y.o. male with no significant past medical history presents with severe right lower quadrant abdominal pain.  He reports that since last night, he started having pain started in his periumbilical area that moved to his right lower quadrant.  He has had some nausea but no significant vomiting.  He still has an appetite.  He denies any constipation or diarrhea or bowel changes but bowel movement did not help his symptoms.  He denies any testicle scrotal, or groin pain.  Denies any urinary changes.  No history of this.  No rashes to suggest shingles.  No history of kidney stones.  No trauma.  No other complaints reported.  He went to urgent care today and due to his exam and history they sent in for evaluation to rule out appendicitis.  On exam, lungs clear and chest nontender.  Abdomen is quite tender in the right lower quadrant primarily.  Bowel sounds are appreciated.  I did not palpate a hernia.  Flanks and back nontender.  Exam otherwise unremarkable.  No rashes.  Clinically I do suspect appendicitis primarily.  We will get CT scan to rule out appendicitis versus kidney stone versus epiploic appendagitis versus diverticulitis versus other causes.  He had some labs in triage that do show a leukocytosis of 11.8 and his CMP is overall reassuring.  Lipase slightly elevated at 101.  Urinalysis shows some trace hemoglobin and protein but no convincing evidence of UTI with no nitrites or leukocytes.  He was offered pain medicine and nausea medicine but he does not want to get that now and wants to wait see what the images show.  CT scan ordered, will reassess after imaging is  completed.  1:25 PM I personally viewed the CT scan and am concerned about acute appendicitis.  Official CT scan read from radiology revealed acute appendicitis.  I spoke to Dr. Sophronia Simas with general surgery who request to be taken to the preoperative area at [facility] and given antibiotics.  She will except the patient for ED to preop transfer for further management today of his appendicitis.  Patient agrees with this plan.  He says he is very anxious and want something to help with his nerves.  He is also or nauseous earlier she will give a dose of Ativan to help with anxiety and nausea.  He will be transferred  to preop area at [facility] for further management.         Final Clinical Impression(s) / ED Diagnoses Final diagnoses:  Acute appendicitis, unspecified acute appendicitis type     Clinical Impression: 1. Acute appendicitis, unspecified acute appendicitis type     Disposition: ED to preop transfer to [facility] for surgical management of appendicitis.  Anticipate admission by general surgery after.  This note was prepared with assistance of Systems analyst. Occasional wrong-word or sound-a-like substitutions may have occurred due to the inherent limitations of voice recognition software.      Eder Macek, Gwenyth Allegra, MD 07/10/22 1330

## 2022-07-11 ENCOUNTER — Encounter (HOSPITAL_COMMUNITY): Payer: Self-pay | Admitting: Surgery

## 2022-07-12 LAB — SURGICAL PATHOLOGY

## 2022-07-13 NOTE — ED Provider Notes (Signed)
[facility]    CSN: [id number] Arrival date & time: 07/10/22  0804      History   Chief Complaint Chief Complaint  Patient presents with   Abdominal Pain    HPI Shawn Riley is a 31 y.o. male.   Patient presenting today with new onset severe RLQ pain, chills, nausea. States sxs started around 9 pm last night with periumbilical pain that has now radiated to RLQ. Movement makes sxs worse. Has not tried anything PO since onset of sxs. Denies fever, vomiting, diarrhea, constipation, new foods or medications. Had a BM this morning with no improvement in sxs. Not trying anything OTC for sxs thus far. No known hx of chronic GI issues.     History reviewed. No pertinent past medical history.  There are no problems to display for this patient.   Past Surgical History:  Procedure Laterality Date   LAPAROSCOPIC APPENDECTOMY N/A 07/10/2022   Procedure: APPENDECTOMY LAPAROSCOPIC;  Surgeon: Dwan Bolt, MD;  Location: [facility];  Service: General;  Laterality: N/A;       Home Medications    Prior to Admission medications   Medication Sig Start Date End Date Taking? Authorizing Provider  acetaminophen (TYLENOL) 500 MG tablet Take 2 tablets (1,000 mg total) by mouth every 6 (six) hours as needed for mild pain. 07/10/22 07/10/23  Jill Alexanders, PA-C  ibuprofen (ADVIL) 200 MG tablet Take 2-3 tablets (400-600 mg total) by mouth every 8 (eight) hours as needed for mild pain or moderate pain. 07/10/22   Jill Alexanders, PA-C  oxyCODONE (OXY IR/ROXICODONE) 5 MG immediate release tablet Take 1 tablet (5 mg total) by mouth every 6 (six) hours as needed for severe pain. 07/10/22   Dwan Bolt, MD    Family History History reviewed. No pertinent family history.  Social History Social History   Tobacco Use   Smoking status: Never   Smokeless tobacco: Former  Substance Use Topics   Alcohol use: Yes     Allergies   Patient has no known allergies.   Review of  Systems Review of Systems PER HPI  Physical Exam Triage Vital Signs ED Triage Vitals  Enc Vitals Group     BP 07/10/22 0814 133/86     Pulse Rate 07/10/22 0814 75     Resp 07/10/22 0814 20     Temp 07/10/22 0814 98.3 F (36.8 C)     Temp Source 07/10/22 0814 Oral     SpO2 07/10/22 0814 96 %     Weight --      Height --      Head Circumference --      Peak Flow --      Pain Score 07/10/22 0815 8     Pain Loc --      Pain Edu? --      Excl. in [place]? --    No data found.  Updated Vital Signs BP 133/86 (BP Location: Right Arm)   Pulse 75   Temp 98.3 F (36.8 C) (Oral)   Resp 20   SpO2 96%   Visual Acuity Right Eye Distance:   Left Eye Distance:   Bilateral Distance:    Right Eye Near:   Left Eye Near:    Bilateral Near:     Physical Exam Vitals and nursing note reviewed.  Constitutional:      Appearance: Normal appearance.  HENT:     Head: Atraumatic.     Mouth/Throat:  Mouth: Mucous membranes are moist.  Eyes:     Extraocular Movements: Extraocular movements intact.     Conjunctiva/sclera: Conjunctivae normal.  Cardiovascular:     Rate and Rhythm: Normal rate and regular rhythm.  Pulmonary:     Effort: Pulmonary effort is normal.     Breath sounds: Normal breath sounds.  Abdominal:     General: Bowel sounds are normal. There is no distension.     Palpations: Abdomen is soft.     Tenderness: There is abdominal tenderness. There is guarding.     Comments: Patient guarding even with stethoscope pressure to the RLQ. + McBurneys, severe ttp RLQ with guarding but no distention and BS present  Musculoskeletal:        General: Normal range of motion.     Cervical back: Normal range of motion and neck supple.  Skin:    General: Skin is warm and dry.  Neurological:     General: No focal deficit present.     Mental Status: He is oriented to person, place, and time.  Psychiatric:        Mood and Affect: Mood normal.        Thought Content: Thought content  normal.        Judgment: Judgment normal.      UC Treatments / Results  Labs (all labs ordered are listed, but only abnormal results are displayed) Labs Reviewed - No data to display  EKG   Radiology No results found.  Procedures Procedures (including critical care time)  Medications Ordered in UC Medications - No data to display  Initial Impression / Assessment and Plan / UC Course  I have reviewed the triage vital signs and the nursing notes.  Pertinent labs & imaging results that were available during my care of the patient were reviewed by me and considered in my medical decision making (see chart for details).     Presentation very concerning for appendicitis. Discussed need for ED evaluation because of these concerns, patient initially hesitant to go to an ED so also provided option for a MedCenter which he feels more comfortable with. Information given for [facility] for further evaluation, patient aware if he requires admission that he would have to be transferred and is ok with this. He is hemodynamically stable for private vehicle transport and will have a family member take him.   Final Clinical Impressions(s) / UC Diagnoses   Final diagnoses:  RLQ abdominal pain  Chills     Discharge Instructions      [facility] Address: [address] [place], [place] [address] Hours:  Open 24 hours Phone: [phone]    ED Prescriptions   None    PDMP not reviewed this encounter.   Roosvelt Maser [place], [place] 07/13/22 [phone]

## 2022-07-26 ENCOUNTER — Ambulatory Visit (INDEPENDENT_AMBULATORY_CARE_PROVIDER_SITE_OTHER): Payer: BC Managed Care – PPO | Admitting: Podiatry

## 2022-07-26 DIAGNOSIS — B351 Tinea unguium: Secondary | ICD-10-CM

## 2022-07-26 DIAGNOSIS — Z79899 Other long term (current) drug therapy: Secondary | ICD-10-CM | POA: Diagnosis not present

## 2022-07-26 NOTE — Patient Instructions (Signed)
Terbinafine Tablets What is this medication? TERBINAFINE (TER bin a feen) treats fungal infections of the nails. It belongs to a group of medications called antifungals. It will not treat infections caused by bacteria or viruses. This medicine may be used for other purposes; ask your health care provider or pharmacist if you have questions. COMMON BRAND NAME(S): Lamisil, Terbinex What should I tell my care team before I take this medication? They need to know if you have any of these conditions: Liver disease An unusual or allergic reaction to terbinafine, other medications, foods, dyes, or preservatives Pregnant or trying to get pregnant Breast-feeding How should I use this medication? Take this medication by mouth with water. Take it as directed on the prescription label at the same time every day. You can take it with or without food. If it upsets your stomach, take it with food. Keep taking it unless your care team tells you to stop. A special MedGuide will be given to you by the pharmacist with each prescription and refill. Be sure to read this information carefully each time. Talk to your care team regarding the use of this medication in children. Special care may be needed. Overdosage: If you think you have taken too much of this medicine contact a poison control center or emergency room at once. NOTE: This medicine is only for you. Do not share this medicine with others. What if I miss a dose? If you miss a dose, take it as soon as you can unless it is more than 4 hours late. If it is more than 4 hours late, skip the missed dose. Take the next dose at the normal time. What may interact with this medication? Do not take this medication with any of the following: Pimozide Thioridazine This medication may also interact with the following: Beta blockers Caffeine Certain medications for mental health conditions Cimetidine Cyclosporine Medications for fungal infections like fluconazole  and ketoconazole Medications for irregular heartbeat like amiodarone, flecainide and propafenone Rifampin Warfarin This list may not describe all possible interactions. Give your health care provider a list of all the medicines, herbs, non-prescription drugs, or dietary supplements you use. Also tell them if you smoke, drink alcohol, or use illegal drugs. Some items may interact with your medicine. What should I watch for while using this medication? Visit your care team for regular checks on your progress. You may need blood work while you are taking this medication. It may be some time before you see the benefit from this medication. This medication may cause serious skin reactions. They can happen weeks to months after starting the medication. Contact your care team right away if you notice fevers or flu-like symptoms with a rash. The rash may be red or purple and then turn into blisters or peeling of the skin. Or, you might notice a red rash with swelling of the face, lips or lymph nodes in your neck or under your arms. This medication can make you more sensitive to the sun. Keep out of the sun, If you cannot avoid being in the sun, wear protective clothing and sunscreen. Do not use sun lamps or tanning beds/booths. What side effects may I notice from receiving this medication? Side effects that you should report to your care team as soon as possible: Allergic reactions--skin rash, itching, hives, swelling of the face, lips, tongue, or throat Change in sense of smell Change in taste Infection--fever, chills, cough, or sore throat Liver injury--right upper belly pain, loss of appetite, nausea,   light-colored stool, dark yellow or brown urine, yellowing skin or eyes, unusual weakness or fatigue Low red blood cell level--unusual weakness or fatigue, dizziness, headache, trouble breathing Lupus-like syndrome--joint pain, swelling, or stiffness, butterfly-shaped rash on the face, rashes that get worse  in the sun, fever, unusual weakness or fatigue Rash, fever, and swollen lymph nodes Redness, blistering, peeling, or loosening of the skin, including inside the mouth Unusual bruising or bleeding Worsening mood, feelings of depression Side effects that usually do not require medical attention (report to your care team if they continue or are bothersome): Diarrhea Gas Headache Nausea Stomach pain Upset stomach This list may not describe all possible side effects. Call your doctor for medical advice about side effects. You may report side effects to FDA at 1-800-FDA-1088. Where should I keep my medication? Keep out of the reach of children and pets. Store between 20 and 25 degrees C (68 and 77 degrees F). Protect from light. Get rid of any unused medication after the expiration date. To get rid of medications that are no longer needed or have expired: Take the medication to a medication take-back program. Check with your pharmacy or law enforcement to find a location. If you cannot return the medication, check the label or package insert to see if the medication should be thrown out in the garbage or flushed down the toilet. If you are not sure, ask your care team. If it is safe to put it in the trash, take the medication out of the container. Mix the medication with cat litter, dirt, coffee grounds, or other unwanted substance. Seal the mixture in a bag or container. Put it in the trash. NOTE: This sheet is a summary. It may not cover all possible information. If you have questions about this medicine, talk to your doctor, pharmacist, or health care provider.  2023 Elsevier/Gold Standard (2021-04-24 00:00:00)  

## 2022-07-26 NOTE — Progress Notes (Signed)
Subjective:   Patient ID: Shawn Riley, male   DOB: 31 y.o.   MRN: [id number]   HPI Chief Complaint  Patient presents with   Nail Problem    Nail fungus Bilaterally, patient denies any pain, yellow, and thick nails,     31 year old male presents with above concerns.  When he is in the [place] it started on the right side but now going to the left foot. No treatment excpet soaking in epsom salts the last 3 weeks. He used lotrion spray. His feet sweat a lot. No pian to the nail. No drainage or pus.    Review of Systems  All other systems reviewed and are negative.  No past medical history on file.  Past Surgical History:  Procedure Laterality Date   LAPAROSCOPIC APPENDECTOMY N/A 07/10/2022   Procedure: APPENDECTOMY LAPAROSCOPIC;  Surgeon: Fritzi Mandes, MD;  Location: [facility];  Service: General;  Laterality: N/A;     Current Outpatient Medications:    acetaminophen (TYLENOL) 500 MG tablet, Take 2 tablets (1,000 mg total) by mouth every 6 (six) hours as needed for mild pain., Disp: 100 tablet, Rfl: 2   ibuprofen (ADVIL) 200 MG tablet, Take 2-3 tablets (400-600 mg total) by mouth every 8 (eight) hours as needed for mild pain or moderate pain., Disp: , Rfl:    terbinafine (LAMISIL) 250 MG tablet, Take 1 tablet (250 mg total) by mouth daily., Disp: 90 tablet, Rfl: 0  No Known Allergies        Objective:  Physical Exam  General: AAO x3, NAD  Dermatological: Toenails are hypertrophic, dystrophic with yellow discoloration.  There is no edema, erythema throughout the sites.  No signs of infection.  No open lesions.  Vascular: Dorsalis Pedis artery and Posterior Tibial artery pedal pulses are 2/4 bilateral with immedate capillary fill time. There is no pain with calf compression, swelling, warmth, erythema.   Neruologic: Grossly intact via light touch bilateral.   Musculoskeletal: No gross boney pedal deformities bilateral. No pain, crepitus, or limitation noted with foot and ankle  range of motion bilateral. Muscular strength 5/5 in all groups tested bilateral.  Gait: Unassisted, Nonantalgic.       Assessment:   Onychomycosis     Plan:  -Treatment options discussed including all alternatives, risks, and complications -Etiology of symptoms were discussed -Discussed treatment options for nail fungus including oral, topical as well as alternative treatments.  At this time the patient wants to proceed with oral Lamisil.  We discussed side effects of the medication and success rates.  We will check a CBC and LFT prior to starting the medication.  Once I receive the results of this I will then call the medication in.   Vivi Barrack DPM

## 2022-07-27 ENCOUNTER — Other Ambulatory Visit: Payer: Self-pay | Admitting: Podiatry

## 2022-07-27 DIAGNOSIS — B351 Tinea unguium: Secondary | ICD-10-CM | POA: Insufficient documentation

## 2022-07-27 DIAGNOSIS — Z79899 Other long term (current) drug therapy: Secondary | ICD-10-CM

## 2022-07-27 LAB — HEPATIC FUNCTION PANEL
ALT: 37 IU/L (ref 0–44)
AST: 20 IU/L (ref 0–40)
Albumin: 4.8 g/dL (ref 4.1–5.1)
Alkaline Phosphatase: 80 IU/L (ref 44–121)
Bilirubin Total: 0.3 mg/dL (ref 0.0–1.2)
Bilirubin, Direct: <0.1 mg/dL (ref 0.00–0.40)
Total Protein: 7.2 g/dL (ref 6.0–8.5)

## 2022-07-27 LAB — CBC WITH DIFFERENTIAL/PLATELET
Basophils Absolute: 0 10*3/uL (ref 0.0–0.2)
Basos: 1 %
EOS (ABSOLUTE): 0.1 10*3/uL (ref 0.0–0.4)
Eos: 1 %
Hematocrit: 48.9 % (ref 37.5–51.0)
Hemoglobin: 16 g/dL (ref 13.0–17.7)
Immature Grans (Abs): 0 10*3/uL (ref 0.0–0.1)
Immature Granulocytes: 0 %
Lymphocytes Absolute: 2 10*3/uL (ref 0.7–3.1)
Lymphs: 31 %
MCH: 29.5 pg (ref 26.6–33.0)
MCHC: 32.7 g/dL (ref 31.5–35.7)
MCV: 90 fL (ref 79–97)
Monocytes Absolute: 0.5 10*3/uL (ref 0.1–0.9)
Monocytes: 8 %
Neutrophils Absolute: 3.8 10*3/uL (ref 1.4–7.0)
Neutrophils: 59 %
Platelets: 264 10*3/uL (ref 150–450)
RBC: 5.43 x10E6/uL (ref 4.14–5.80)
RDW: 12.9 % (ref 11.6–15.4)
WBC: 6.3 10*3/uL (ref 3.4–10.8)

## 2022-07-27 MED ORDER — TERBINAFINE HCL 250 MG PO TABS: 250.0000 mg | ORAL_TABLET | Freq: Every day | ORAL | 0 refills | Status: DC

## 2022-07-30 ENCOUNTER — Telehealth: Payer: Self-pay

## 2022-07-30 ENCOUNTER — Other Ambulatory Visit: Payer: Self-pay | Admitting: Podiatry

## 2022-07-30 MED ORDER — ITRACONAZOLE 100 MG PO CAPS: ORAL_CAPSULE | ORAL | 0 refills | Status: DC

## 2022-07-31 NOTE — Telephone Encounter (Signed)
Pt called stating he called yesterday but never heard back but did get a new prescription called in so he was confused.  I checked the chart and gave him the advise that Dr Jacqualyn Posey put in the chart. That to stop the current medication and do not start the new medication until all intestinal issues resolve. HE said he did not have the intestinal issues since yesterday and is not feeling like he did when he took the medication. He is going to make sure the intestinal issues have resolved for a few days and will call if any issues with the new medication once started.  He said thank you to me for the information.

## 2022-08-02 ENCOUNTER — Encounter (HOSPITAL_COMMUNITY): Payer: Self-pay

## 2022-08-02 ENCOUNTER — Ambulatory Visit (INDEPENDENT_AMBULATORY_CARE_PROVIDER_SITE_OTHER): Payer: BC Managed Care – PPO | Admitting: Clinical

## 2022-08-02 DIAGNOSIS — F331 Major depressive disorder, recurrent, moderate: Secondary | ICD-10-CM | POA: Diagnosis not present

## 2022-08-02 NOTE — Progress Notes (Signed)
Virtual Visit via Telephone Note   I connected with Shawn Riley on 08/02/22 at  8:00 AM EDT by telephone and verified that I am speaking with the correct person using two identifiers.   Location: Patient: Home Provider: Office   I discussed the limitations, risks, security and privacy concerns of performing an evaluation and management service by telephone and the availability of in person appointments. I also discussed with the patient that there may be a patient responsible charge related to this service. The patient expressed understanding and agreed to proceed.   Therapy Progress Note   Session Time: 8:00 AM-8:55 AM   Participation Level: Active   Behavioral Response: CasualAlertDepressed   Type of Therapy: Individual Therapy   Treatment Goals addressed: Coping   Interventions: CBT and Strength-based   Summary: Shawn Riley is a 31 y.o. male who presents with Depression with Anxiety. The OPT therapist worked with the patient for his ongoing OPT treatment. The OPT therapist utilized Motivational Interviewing to assist in creating therapeutic repore. The patient in the session was engaged and work in collaboration giving feedback about his triggers and symptoms over the past few weeks. The patient had a health scare and surgical procedure having his appendix removed.The patient spoke about ongoing current situation with his pregnant ex-girlfriend including working out the girlfriend getting out of her lease.  The patient spoke about this realization that things are still very much in the air and that what is going to happen moving forward will have pieces that are in his control and parts that are not going to be in control continuing to work to take things day by day. The patient spoke about recent implementation of coping strategies to assist the patient in reconciling with his ex-girlfreind who has moved back in.The OPT therapist utilized Cognitive Behavioral Therapy through  cognitive restructuring as well as worked with the patient on coping strategies to assist in management of his mental health symptoms. The OPT therapist provided empathetic support allowing the patient to disclose his feelings . The patient spoke about stressors including his partner telling her Father about her pregnancy. The patient spoke about utilizing his pause button recently in a serious conversation with his Father. The patient was restricted to work from home post his appendix surgery and continues to recover.The OPT therapist continued to work with the patient on his emotion/reactive behavior and self growth.   Suicidal/Homicidal: Nowithout intent/plan   Therapist Response: The OPT therapist worked with the patient for the patients scheduled session. The patient was engaged in his session and gave feedback in relation to triggers, symptoms, and behavior responses over the past few weeks.The OPT therapist worked with the patient utilizing an in session Cognitive Behavioral Therapy exercise. The patient reviewed with the OPT therapist coping skills to help manage his mood while going through the transition of living with long term girlfriend who is currently pregnant. The patient in this session noted, " I had my appendix removed I was just in a lot of random pain in that area and decided I had to get looked at and thankfully I did and they were able to get me into surgery and remove my appendix in a hurry it only took about a hour with there technology now and I was out with minimal scaring". The OPT therapist placed strong emphasis on the patient focusing on his basic health needs, using coping skills, and using his support network. The patient worked in session with the [place] therapist on exercise  in my control vs out of my control.The OPT therapist will continue treatment work with the patient in his next scheduled session.   Plan: Return again in 2/3 weeks.   Diagnosis:      Axis I: Major  depressive disorder, recurrent episode, moderate                           Axis II: No diagnosis   Collaboration of Care: No additional collaboration of care for this session.     Patient/Guardian was advised Release of Information must be obtained prior to any record release in order to collaborate their care with an outside provider. Patient/Guardian was advised if they have not already done so to contact the registration department to sign all necessary forms in order for [place] to release information regarding their care.    Consent: Patient/Guardian gives verbal consent for treatment and assignment of benefits for services provided during this visit. Patient/Guardian expressed understanding and agreed to proceed     I discussed the assessment and treatment plan with the patient. The patient was provided an opportunity to ask questions and all were answered. The patient agreed with the plan and demonstrated an understanding of the instructions.   The patient was advised to call back or seek an in-person evaluation if the symptoms worsen or if the condition fails to improve as anticipated.   I provided 55 minutes of non-face-to-face time during this encounter.   Lennox Grumbles, LCSW   08/02/2022

## 2022-08-02 NOTE — Plan of Care (Signed)
Verbal consent  

## 2022-08-23 ENCOUNTER — Ambulatory Visit (INDEPENDENT_AMBULATORY_CARE_PROVIDER_SITE_OTHER): Payer: BC Managed Care – PPO | Admitting: Clinical

## 2022-08-23 DIAGNOSIS — F419 Anxiety disorder, unspecified: Secondary | ICD-10-CM | POA: Diagnosis not present

## 2022-08-23 DIAGNOSIS — F331 Major depressive disorder, recurrent, moderate: Secondary | ICD-10-CM | POA: Diagnosis not present

## 2022-08-23 NOTE — Progress Notes (Signed)
Virtual Visit via Telephone Note   I connected with Shawn Riley on 09/12/22 at  8:00 AM EDT by telephone and verified that I am speaking with the correct person using two identifiers.   Location: Patient: Home Provider: Office   I discussed the limitations, risks, security and privacy concerns of performing an evaluation and management service by telephone and the availability of in person appointments. I also discussed with the patient that there may be a patient responsible charge related to this service. The patient expressed understanding and agreed to proceed.   Therapy Progress Note   Session Time: 8:00 AM-8:30 AM   Participation Level: Active   Behavioral Response: CasualAlertDepressed   Type of Therapy: Individual Therapy   Treatment Goals addressed: Coping   Interventions: CBT and Strength-based   Summary: Shawn Riley is a 31 y.o. male who presents with Depression with Anxiety. The OPT therapist worked with the patient for his ongoing OPT treatment. The OPT therapist utilized Motivational Interviewing to assist in creating therapeutic repore. The patient in the session was engaged and work in collaboration giving feedback about his triggers and symptoms over the past few weeks. The patient spoke about being able to get his girlfriend getting out of her old lease allowing for her to move back in with the patient. The patient spoke about the health of the baby with his partner currently being pregnant and them recently finding out the gender of the baby. The patient spoke about how the couples other child is taking finding out they are going to have a new sibling. The patient spoke about checking off a barrier with telling his partners Father that they are expecting. The patient identified ongoing desire to work on/ improve his relationship with his Father. The patient spoke about recently deciding to go back to school to further his education and signing up for a college course  this being a change to allow him future upward mobility within the company he works for.  The patient spoke about this realization that things are still very much in the air and that what is going to happen moving forward will have pieces that are in his control and parts that are not going to be in control continuing to work to take things day by day. The OPT therapist utilized Cognitive Behavioral Therapy through cognitive restructuring as well as worked with the patient on coping strategies to assist in management of his mental health symptoms. The OPT therapist provided empathetic support allowing the patient to disclose his feelings . The patient noted feeling a lot better physically post having appendix removed a few weeks ago.The OPT therapist continued to work with the patient on his emotion/reactive behavior and self growth.   Suicidal/Homicidal: Nowithout intent/plan   Therapist Response: The OPT therapist worked with the patient for the patients scheduled session. The patient was engaged in his session and gave feedback in relation to triggers, symptoms, and behavior responses over the past few weeks.The OPT therapist worked with the patient utilizing an in session Cognitive Behavioral Therapy exercise. The patient reviewed with the OPT therapist coping skills to help manage his mood while going through the transition of reuniting his family. The OPT therapist placed strong emphasis on the patient focusing on his basic health needs, using coping skills, and using his support network. The patient worked in session with the OPT therapist on exercise in my control vs out of my control. The patient spoke about the success of moving through previously identified  barriers including telling his partners Father they are expecting, getting his partner out of a lease, and telling their child they will be having a sibling. The OPT therapist will continue treatment work with the patient in his next scheduled  session.   Plan: Return again in 2/3 weeks.   Diagnosis:      Axis I: Major depressive disorder, recurrent episode, moderate                           Axis II: No diagnosis   Collaboration of Care: No additional collaboration of care for this session.     Patient/Guardian was advised Release of Information must be obtained prior to any record release in order to collaborate their care with an outside provider. Patient/Guardian was advised if they have not already done so to contact the registration department to sign all necessary forms in order for [place] to release information regarding their care.    Consent: Patient/Guardian gives verbal consent for treatment and assignment of benefits for services provided during this visit. Patient/Guardian expressed understanding and agreed to proceed     I discussed the assessment and treatment plan with the patient. The patient was provided an opportunity to ask questions and all were answered. The patient agreed with the plan and demonstrated an understanding of the instructions.   The patient was advised to call back or seek an in-person evaluation if the symptoms worsen or if the condition fails to improve as anticipated.   I provided 30 minutes of non-face-to-face time during this encounter.   [name], LCSW   08/23/2022

## 2022-09-20 ENCOUNTER — Ambulatory Visit (HOSPITAL_COMMUNITY): Payer: BC Managed Care – PPO | Admitting: Clinical

## 2022-10-31 ENCOUNTER — Ambulatory Visit (INDEPENDENT_AMBULATORY_CARE_PROVIDER_SITE_OTHER): Payer: BC Managed Care – PPO | Admitting: Podiatry

## 2022-10-31 DIAGNOSIS — Z79899 Other long term (current) drug therapy: Secondary | ICD-10-CM

## 2022-10-31 DIAGNOSIS — B351 Tinea unguium: Secondary | ICD-10-CM | POA: Diagnosis not present

## 2022-10-31 NOTE — Progress Notes (Signed)
Subjective: Chief Complaint  Patient presents with   Follow-up    Bilateral nails foot fungus     32 year old male presents the office with above concerns.  Small black spot on the left hallux that he just noticed but otherwise doing well.  No pain, swelling redness or drainage.  No open lesions.  No other concerns.  Objective: AAO x3, NAD DP/PT pulses palpable bilaterally, CRT less than 3 seconds On the right side does appear the nails are growing out.  Explained on the proximal nail folds and the distal portions are still hypertrophic, dystrophic yellow, brown discoloration.  On the left hallux along the lateral nail border is 1 small darkened area which I think is consistent with dried blood.  There is no pain, swelling redness or drainage. No pain with calf compression, swelling, warmth, erythema       Assessment: Onychomycosis  Plan: -All treatment options discussed with the patient including all alternatives, risks, complications.  -He is to be doing well.  As a courtesy debrided the nails.  Will recheck CBC LFT and then continue the itraconazole that seems to be helping. -Will monitor the left hallux dark spot.  This should hopefully grow out over time.  If no improvement or worsening consider biopsy. -Patient encouraged to call the office with any questions, concerns, change in symptoms.   Trula Slade DPM

## 2022-10-31 NOTE — Patient Instructions (Signed)
Itraconazole Capsules or Tablets What is this medication? ITRACONAZOLE (i tra KO na zole) treats fungal infections. It belongs to a group of medications called antifungals. It will not treat colds, the flu, or infections caused by bacteria or viruses. This medicine may be used for other purposes; ask your health care provider or pharmacist if you have questions. COMMON BRAND NAME(S): ONMEL, Sporanox, TOLSURA What should I tell my care team before I take this medication? They need to know if you have any of these conditions: COPD, including chronic bronchitis or emphysema Heart disease Immune system problems Kidney disease Liver disease An unusual or allergic reaction to itraconazole, other medications, foods, dyes, or preservatives Pregnant or trying to get pregnant Breast-feeding How should I use this medication? Take this medication by mouth. Take it as directed on the prescription label at the same time every day. Do not cut, crush, or chew this medication. Swallow whole. Take it with food. Keep taking it unless your care team tells you to stop. Take antacids, H2-blockers, or proton pump inhibitors at a different time of day than this medication. Take these products 2 hours BEFORE or 2 hours AFTER this medication. Talk to your care team about the use of this medication in children. Special care may be needed. Overdosage: If you think you have taken too much of this medicine contact a poison control center or emergency room at once. NOTE: This medicine is only for you. Do not share this medicine with others. What if I miss a dose? If you miss a dose, take it as soon as you can. If it is almost time for your next dose, take only that dose. Do not take double or extra doses. What may interact with this medication? Do not take this medication with any of the following: Adagrasib Alfuzosin Alprazolam Avanafil Certain medications for blood pressure, such as felodipine, nisoldipine Certain  medications for cholesterol, such as cerivastatin, lovastatin, simvastatin, lomitapide Certain medications for the heart, such as disopyramide, dofetilide, dronedarone, eplerenone, ivabradine, quinidine, ranolazine Cisapride Colchicine (if you have liver or kidney problems) Conivaptan Ergot alkaloids, such as dihydroergotamine, ergonovine, ergotamine, methylergonovine Irinotecan Isavuconazole Lurasidone Methadone Midazolam Naloxegol Nevirapine Other medications that cause heart rhythm changes Pimozide Red yeast rice Sirolimus Thioridazine Ticagrelor Triazolam Venetoclax This medication may also interact with the following: Aliskiren Amlodipine Antacids Aprepitant Atorvastatin Buprenorphine Certain antibiotics, such as ciprofloxacin, clarithromycin, erythromycin Certain antivirals for HIV or hepatitis Certain medications for bladder problems, such as fesoterodine, solifenacin, tolterodine Certain medications for cancer, such as axitinib, bortezomib, busulfan, dabrafenib, dasatinib, docetaxel, erlotinib, gefitinib, ibrutinib, imatinib, ixabepilone, lapatinib, nilotinib, ponatinib, sunitinib, trabectedin, trimetrexate, vinca alkaloids Certain medications for erectile dysfunction, such as vardenafil, sildenafil, tadalafil Certain medications for mental health conditions, such as aripiprazole, buspirone, diazepam, haloperidol, perospirone, quetiapine, risperidone Certain medications for pain, such as alfentanil, fentanyl, oxycodone, sufentanil Certain medications for seizures, such as carbamazepine, phenytoin Certain medications for tuberculosis, such as isoniazid, rifabutin, rifampin, rifapentine Certain medications that treat or prevent blood clots, such as warfarin, dabigatran, rivaroxaban Cilostazol Cinacalcet Cyclosporine Digoxin Eletriptan Everolimus Halofantrine Isradipine Meloxicam Nadolol Nifedipine Other medications for fungal  infections Praziquantel Ramelteon Repaglinide Salmeterol Saxagliptin Steroid medications, such as prednisone or cortisone Tacrolimus Tamsulosin Tolvaptan Verapamil Ziprasidone This list may not describe all possible interactions. Give your health care provider a list of all the medicines, herbs, non-prescription drugs, or dietary supplements you use. Also tell them if you smoke, drink alcohol, or use illegal drugs. Some items may interact with your medicine. What should I watch  for while using this medication? Visit your care team for regular checks on your progress. Tell your care team if your symptoms do not start to get better or if they get worse. You may need blood work while taking this medication. This medication may affect your coordination, reaction time, or judgment. Do not drive or operate machinery until you know how this medication affects you. Sit up or stand slowly to reduce the risk of dizzy or fainting spells. What side effects may I notice from receiving this medication? Side effects that you should report to your doctor or health care team as soon as possible: Allergic reactions--skin rash, itching, hives, swelling of the face, lips, tongue, or throat Hearing loss Heart failure--shortness of breath, swelling of the ankles, feet, or hands, sudden weight gain, unusual weakness or fatigue Liver injury--right upper belly pain, loss of appetite, nausea, light-colored stool, dark yellow or brown urine, yellowing skin or eyes, unusual weakness or fatigue Pain, tingling, or numbness in the hands or feet Side effects that usually do not require medical attention (report to your doctor or health care team if they continue or are bothersome): Blurry vision Diarrhea Dizziness Headache Nausea Vomiting This list may not describe all possible side effects. Call your doctor for medical advice about side effects. You may report side effects to FDA at 1-800-FDA-1088. Where should I  keep my medication? Keep out of the reach of children and pets. Store between 15 and 25 degrees C (59 and 77 degrees F). Protect from light and moisture. Keep the container tightly closed. Get rid of any unused medication after the expiration date. To get rid of medications that are no longer needed or have expired: Take the medication to a medication take-back program. Check with your pharmacy or law enforcement to find a location. If you cannot return the medication, check the label or package insert to see if the medication should be thrown out in the garbage or flushed down the toilet. If you are not sure, ask your care team. If it is safe to put it in the trash, take the medication out of the container. Mix the medication with cat litter, dirt, coffee grounds, or other unwanted substance. Seal the mixture in a bag or container. Put it in the trash. Marland Kitchen NOTE: This sheet is a summary. It may not cover all possible information. If you have questions about this medicine, talk to your doctor, pharmacist, or health care provider.  2023 Elsevier/Gold Standard (2021-09-26 00:00:00)

## 2022-11-01 ENCOUNTER — Other Ambulatory Visit: Payer: Self-pay | Admitting: Podiatry

## 2022-11-01 DIAGNOSIS — Z79899 Other long term (current) drug therapy: Secondary | ICD-10-CM

## 2022-11-01 LAB — COMPREHENSIVE METABOLIC PANEL
ALT: 42 IU/L (ref 0–44)
AST: 21 IU/L (ref 0–40)
Albumin/Globulin Ratio: 2.1 (ref 1.2–2.2)
Albumin: 4.6 g/dL (ref 4.1–5.1)
Alkaline Phosphatase: 96 IU/L (ref 44–121)
BUN/Creatinine Ratio: 12 (ref 9–20)
BUN: 11 mg/dL (ref 6–20)
Bilirubin Total: 0.4 mg/dL (ref 0.0–1.2)
CO2: 24 mmol/L (ref 20–29)
Calcium: 8.6 mg/dL — ABNORMAL LOW (ref 8.7–10.2)
Chloride: 106 mmol/L (ref 96–106)
Creatinine, Ser: 0.9 mg/dL (ref 0.76–1.27)
Globulin, Total: 2.2 g/dL (ref 1.5–4.5)
Glucose: 82 mg/dL (ref 70–99)
Potassium: 4.2 mmol/L (ref 3.5–5.2)
Sodium: 145 mmol/L — ABNORMAL HIGH (ref 134–144)
Total Protein: 6.8 g/dL (ref 6.0–8.5)
eGFR: 116 mL/min/{1.73_m2} (ref >59)

## 2022-11-01 LAB — CBC WITH DIFFERENTIAL/PLATELET
Basophils Absolute: 0 10*3/uL (ref 0.0–0.2)
Basos: 1 %
EOS (ABSOLUTE): 0.1 10*3/uL (ref 0.0–0.4)
Eos: 1 %
Hematocrit: 47.7 % (ref 37.5–51.0)
Hemoglobin: 16.2 g/dL (ref 13.0–17.7)
Immature Grans (Abs): 0 10*3/uL (ref 0.0–0.1)
Immature Granulocytes: 0 %
Lymphocytes Absolute: 1.6 10*3/uL (ref 0.7–3.1)
Lymphs: 37 %
MCH: 29.8 pg (ref 26.6–33.0)
MCHC: 34 g/dL (ref 31.5–35.7)
MCV: 88 fL (ref 79–97)
Monocytes Absolute: 0.3 10*3/uL (ref 0.1–0.9)
Monocytes: 6 %
Neutrophils Absolute: 2.4 10*3/uL (ref 1.4–7.0)
Neutrophils: 55 %
Platelets: 221 10*3/uL (ref 150–450)
RBC: 5.44 x10E6/uL (ref 4.14–5.80)
RDW: 12.3 % (ref 11.6–15.4)
WBC: 4.4 10*3/uL (ref 3.4–10.8)

## 2022-11-01 MED ORDER — ITRACONAZOLE 100 MG PO CAPS: ORAL_CAPSULE | ORAL | 0 refills | Status: AC

## 2023-02-03 ENCOUNTER — Ambulatory Visit: Payer: BC Managed Care – PPO | Admitting: Podiatry

## 2023-02-06 ENCOUNTER — Encounter: Payer: Self-pay | Admitting: Podiatry

## 2023-02-06 ENCOUNTER — Ambulatory Visit (INDEPENDENT_AMBULATORY_CARE_PROVIDER_SITE_OTHER): Payer: BC Managed Care – PPO | Admitting: Podiatry

## 2023-02-06 DIAGNOSIS — B351 Tinea unguium: Secondary | ICD-10-CM | POA: Diagnosis not present

## 2023-02-08 NOTE — Progress Notes (Signed)
Subjective: Chief Complaint  Patient presents with   Nail Problem    Follow up nail fungus   "I think they are looking better"     32 year old male presents the office with above concerns.  He states he is doing much better.  He said fungus is gone but would like to get a culture to make sure.  No open lesions.  No drainage.  No pain.   Objective: AAO x3, NAD DP/PT pulses palpable bilaterally, CRT less than 3 seconds On the right side does appear the nails are growing out overall the color is much improved.  There is no pain in the nail there is no swelling redness or drainage.  Some slight discoloration still noted but significantly improved compared to what it was. No pain with calf compression, swelling, warmth, erythema   Assessment: Onychomycosis  Plan: -All treatment options discussed with the patient including all alternatives, risks, complications.  -Overall doing much better.  I did debride the nails with any complications or bleeding from this for culture sensitivities any residual fungus. -We discussed other measures including changing shoes and socks and keeping the feet dry to avoid excess moisture to avoid any further fungal infections.  Shawn Riley DPM

## 2023-02-18 ENCOUNTER — Other Ambulatory Visit: Payer: Self-pay | Admitting: Podiatry

## 2023-02-18 MED ORDER — EFINACONAZOLE 10 % EX SOLN: 1.0000 [drp] | Freq: Every day | CUTANEOUS | 11 refills | Status: AC

## 2023-07-13 ENCOUNTER — Emergency Department (HOSPITAL_BASED_OUTPATIENT_CLINIC_OR_DEPARTMENT_OTHER)
Admission: EM | Admit: 2023-07-13 | Discharge: 2023-07-14 | Disposition: A | Discharge status: Home / Self Care | Payer: BC Managed Care – PPO | Attending: Emergency Medicine | Admitting: Emergency Medicine

## 2023-07-13 ENCOUNTER — Encounter (HOSPITAL_BASED_OUTPATIENT_CLINIC_OR_DEPARTMENT_OTHER): Payer: Self-pay

## 2023-07-13 ENCOUNTER — Other Ambulatory Visit: Payer: Self-pay

## 2023-07-13 DIAGNOSIS — M79661 Pain in right lower leg: Secondary | ICD-10-CM | POA: Insufficient documentation

## 2023-07-13 DIAGNOSIS — M79604 Pain in right leg: Secondary | ICD-10-CM

## 2023-07-13 LAB — CBC
HCT: 44.3 % (ref 39.0–52.0)
Hemoglobin: 15.4 g/dL (ref 13.0–17.0)
MCH: 30.3 pg (ref 26.0–34.0)
MCHC: 34.8 g/dL (ref 30.0–36.0)
MCV: 87 fL (ref 80.0–100.0)
Platelets: 249 10*3/uL (ref 150–400)
RBC: 5.09 MIL/uL (ref 4.22–5.81)
RDW: 11.9 % (ref 11.5–15.5)
WBC: 5.4 10*3/uL (ref 4.0–10.5)
nRBC: 0 % (ref 0.0–0.2)

## 2023-07-13 LAB — BASIC METABOLIC PANEL
Anion gap: 9 (ref 5–15)
BUN: 11 mg/dL (ref 6–20)
CO2: 23 mmol/L (ref 22–32)
Calcium: 8.8 mg/dL — ABNORMAL LOW (ref 8.9–10.3)
Chloride: 106 mmol/L (ref 98–111)
Creatinine, Ser: 0.82 mg/dL (ref 0.61–1.24)
GFR, Estimated: >60 mL/min (ref >60)
Glucose, Bld: 104 mg/dL — ABNORMAL HIGH (ref 70–99)
Potassium: 3.4 mmol/L — ABNORMAL LOW (ref 3.5–5.1)
Sodium: 138 mmol/L (ref 135–145)

## 2023-07-13 LAB — D-DIMER, QUANTITATIVE: D-Dimer, Quant: <0.27 ug{FEU}/mL (ref 0.00–0.50)

## 2023-07-13 NOTE — ED Triage Notes (Signed)
Pt states that since this afternoon he has been having pain in his R calf. Tender to touch

## 2023-07-14 NOTE — Discharge Instructions (Signed)
You were seen today with concern for right leg pain.  Take Tylenol for any ongoing discomfort.  Return later today for ultrasound imaging.

## 2023-07-14 NOTE — ED Provider Notes (Signed)
[place] [facility] AT [facility] Provider Note   CSN: [id number] Arrival date & time: 07/13/23  2004     History  Chief Complaint  Patient presents with   Leg Pain    Shawn Riley is a 32 y.o. male.  HPI     This is a 32 year old male who presents with right leg pain.  Patient reports he had onset of symptoms yesterday morning.  He states that he has a very achy pain in the right calf.  Has not had any overlying skin changes.  No history of trauma.  He does state that he lifts weights.  No systemic symptoms.  Denies chest pain or shortness of breath.  No history of blood clots.  Home Medications Prior to Admission medications   Medication Sig Start Date End Date Taking? Authorizing Provider  Efinaconazole 10 % SOLN Apply 1 drop topically daily. 02/18/23   Vivi Barrack, DPM  ibuprofen (ADVIL) 200 MG tablet Take 2-3 tablets (400-600 mg total) by mouth every 8 (eight) hours as needed for mild pain or moderate pain. 07/10/22   Adam Phenix, PA-C  itraconazole (SPORANOX) 100 MG capsule Take 200mg  once a day for 1 week. Then take a 3 week break and then repeat. Take the medication one week per month 11/01/22   Vivi Barrack, DPM      Allergies    Terbinafine and related    Review of Systems   Review of Systems  Constitutional:  Negative for fever.  Cardiovascular:  Negative for leg swelling.  Musculoskeletal:        Right leg pain  All other systems reviewed and are negative.   Physical Exam Updated Vital Signs BP 133/89   Pulse 68   Temp 98.5 F (36.9 C)   Resp 18   Ht 1.753 m (5\' 9" )   Wt 72.1 kg   SpO2 98%   BMI 23.48 kg/m  Physical Exam Vitals and nursing note reviewed.  Constitutional:      Appearance: He is well-developed. He is not ill-appearing.  HENT:     Head: Normocephalic and atraumatic.  Eyes:     Pupils: Pupils are equal, round, and reactive to light.  Cardiovascular:     Rate and Rhythm: Normal rate and  regular rhythm.  Pulmonary:     Effort: Pulmonary effort is normal. No respiratory distress.  Abdominal:     Palpations: Abdomen is soft.  Musculoskeletal:     Cervical back: Neck supple.     Comments: Tenderness to palpation right calf without overlying skin changes, no bruising, no asymmetric swelling  Lymphadenopathy:     Cervical: No cervical adenopathy.  Skin:    General: Skin is warm and dry.  Neurological:     Mental Status: He is alert and oriented to person, place, and time.  Psychiatric:        Mood and Affect: Mood normal.     ED Results / Procedures / Treatments   Labs (all labs ordered are listed, but only abnormal results are displayed) Labs Reviewed  BASIC METABOLIC PANEL - Abnormal; Notable for the following components:      Result Value   Potassium 3.4 (*)    Glucose, Bld 104 (*)    Calcium 8.8 (*)    All other components within normal limits  CBC  D-DIMER, QUANTITATIVE    EKG None  Radiology No results found.  Procedures Procedures    Medications Ordered in ED Medications - No  data to display  ED Course/ Medical Decision Making/ A&P                                 Medical Decision Making Amount and/or Complexity of Data Reviewed Labs: ordered.   This patient presents to the ED for concern of leg pain, this involves an extensive number of treatment options, and is a complaint that carries with it a high risk of complications and morbidity.  I considered the following differential and admission for this acute, potentially life threatening condition.  The differential diagnosis includes muscle strain, contusion, blood clot  MDM:    This is a 32 year old male who presents with right leg pain.  He is overall nontoxic and vital signs are reassuring.  He has point tenderness to the right calf.  No overlying skin changes or asymmetric swelling.  Labs obtained and largely reassuring including D-dimer.  Discussed with him that he is low risk for DVT  and D-dimer is very reassuring; however, given the absence of other obvious cause, would recommend ultrasound imaging for full confirmation.  Patient is agreeable plan.  (Labs, imaging, consults)  Labs: I Ordered, and personally interpreted labs.  The pertinent results include: CBC, BMP, D-dimer  Imaging Studies ordered: I ordered imaging studies including none I independently visualized and interpreted imaging. I agree with the radiologist interpretation  Additional history obtained from chart review.  External records from outside source obtained and reviewed including prior evaluations  Cardiac Monitoring: The patient was maintained on a cardiac monitor.  If on the cardiac monitor, I personally viewed and interpreted the cardiac monitored which showed an underlying rhythm of: Sinus rhythm  Reevaluation: After the interventions noted above, I reevaluated the patient and found that they have :stayed the same  Social Determinants of Health:  lives independently  Disposition: Discharge  Co morbidities that complicate the patient evaluation History reviewed. No pertinent past medical history.   Medicines No orders of the defined types were placed in this encounter.   I have reviewed the patients home medicines and have made adjustments as needed  Problem List / ED Course: Problem List Items Addressed This Visit   None Visit Diagnoses     Pain of right lower extremity    -  Primary                   Final Clinical Impression(s) / ED Diagnoses Final diagnoses:  Pain of right lower extremity    Rx / DC Orders ED Discharge Orders          Ordered    US Venous Img Lower Unilateral Right        07/14/23 0002              Shon Baton, MD 07/14/23 0005
